# Patient Record
Sex: Female | Born: 1980
Health system: Southern US, Community
[De-identification: ages and names within clinical notes are randomized; demographics above are authoritative.]

## PROBLEM LIST (undated history)

## (undated) DIAGNOSIS — Z8759 Personal history of other complications of pregnancy, childbirth and the puerperium: Secondary | ICD-10-CM

## (undated) DIAGNOSIS — F32A Depression, unspecified: Secondary | ICD-10-CM

## (undated) DIAGNOSIS — F419 Anxiety disorder, unspecified: Secondary | ICD-10-CM

## (undated) DIAGNOSIS — F329 Major depressive disorder, single episode, unspecified: Secondary | ICD-10-CM

## (undated) DIAGNOSIS — F909 Attention-deficit hyperactivity disorder, unspecified type: Secondary | ICD-10-CM

## (undated) DIAGNOSIS — D72819 Decreased white blood cell count, unspecified: Secondary | ICD-10-CM

## (undated) DIAGNOSIS — D649 Anemia, unspecified: Secondary | ICD-10-CM

## (undated) DIAGNOSIS — O1490 Unspecified pre-eclampsia, unspecified trimester: Secondary | ICD-10-CM

## (undated) HISTORY — DX: Attention-deficit hyperactivity disorder, unspecified type: F90.9

## (undated) HISTORY — PX: BREAST BIOPSY: SHX20

## (undated) HISTORY — DX: Depression, unspecified: F32.A

## (undated) HISTORY — DX: Unspecified pre-eclampsia, unspecified trimester: O14.90

## (undated) HISTORY — DX: Anxiety disorder, unspecified: F41.9

## (undated) HISTORY — DX: Decreased white blood cell count, unspecified: D72.819

## (undated) HISTORY — DX: Major depressive disorder, single episode, unspecified: F32.9

## (undated) HISTORY — DX: Anemia, unspecified: D64.9

## (undated) HISTORY — DX: Personal history of other complications of pregnancy, childbirth and the puerperium: Z87.59

---
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
## 2001-05-16 ENCOUNTER — Other Ambulatory Visit: Admission: RE | Admit: 2001-05-16 | Discharge: 2001-05-16 | Payer: Self-pay | Admitting: Family Medicine

## 2002-05-08 ENCOUNTER — Other Ambulatory Visit: Admission: RE | Admit: 2002-05-08 | Discharge: 2002-05-08 | Payer: Self-pay | Admitting: Family Medicine

## 2002-09-22 ENCOUNTER — Ambulatory Visit (HOSPITAL_COMMUNITY): Admission: RE | Admit: 2002-09-22 | Discharge: 2002-09-22 | Payer: Self-pay | Admitting: Oncology

## 2002-09-22 ENCOUNTER — Encounter (INDEPENDENT_AMBULATORY_CARE_PROVIDER_SITE_OTHER): Payer: Self-pay | Admitting: Specialist

## 2003-01-09 ENCOUNTER — Emergency Department (HOSPITAL_COMMUNITY): Admission: EM | Admit: 2003-01-09 | Discharge: 2003-01-09 | Payer: Self-pay | Admitting: Emergency Medicine

## 2003-05-10 ENCOUNTER — Other Ambulatory Visit: Admission: RE | Admit: 2003-05-10 | Discharge: 2003-05-10 | Payer: Self-pay | Admitting: Family Medicine

## 2004-05-21 ENCOUNTER — Other Ambulatory Visit: Admission: RE | Admit: 2004-05-21 | Discharge: 2004-05-21 | Payer: Self-pay | Admitting: Family Medicine

## 2004-05-22 ENCOUNTER — Other Ambulatory Visit: Admission: RE | Admit: 2004-05-22 | Discharge: 2004-05-22 | Payer: Self-pay | Admitting: Family Medicine

## 2004-12-10 ENCOUNTER — Encounter: Admission: RE | Admit: 2004-12-10 | Discharge: 2004-12-10 | Payer: Self-pay | Admitting: Family Medicine

## 2004-12-10 ENCOUNTER — Ambulatory Visit: Payer: Self-pay | Admitting: Family Medicine

## 2004-12-31 ENCOUNTER — Ambulatory Visit: Payer: Self-pay | Admitting: Internal Medicine

## 2005-06-16 ENCOUNTER — Other Ambulatory Visit: Admission: RE | Admit: 2005-06-16 | Discharge: 2005-06-16 | Payer: Self-pay | Admitting: Family Medicine

## 2005-06-16 ENCOUNTER — Ambulatory Visit: Payer: Self-pay | Admitting: Family Medicine

## 2005-07-28 ENCOUNTER — Ambulatory Visit: Payer: Self-pay | Admitting: Family Medicine

## 2005-08-27 ENCOUNTER — Ambulatory Visit: Payer: Self-pay | Admitting: Oncology

## 2005-10-29 ENCOUNTER — Ambulatory Visit: Payer: Self-pay | Admitting: Oncology

## 2007-06-08 ENCOUNTER — Ambulatory Visit: Payer: Self-pay | Admitting: Family Medicine

## 2007-06-08 DIAGNOSIS — G4700 Insomnia, unspecified: Secondary | ICD-10-CM | POA: Insufficient documentation

## 2007-11-16 LAB — CONVERTED CEMR LAB: Pap Smear: NORMAL

## 2008-05-17 ENCOUNTER — Telehealth (INDEPENDENT_AMBULATORY_CARE_PROVIDER_SITE_OTHER): Payer: Self-pay | Admitting: *Deleted

## 2008-06-21 ENCOUNTER — Ambulatory Visit: Payer: Self-pay | Admitting: Family Medicine

## 2008-06-26 ENCOUNTER — Ambulatory Visit: Payer: Self-pay | Admitting: Family Medicine

## 2008-11-21 ENCOUNTER — Ambulatory Visit (HOSPITAL_COMMUNITY): Admission: RE | Admit: 2008-11-21 | Discharge: 2008-11-21 | Payer: Self-pay | Admitting: Obstetrics and Gynecology

## 2009-02-06 ENCOUNTER — Inpatient Hospital Stay (HOSPITAL_COMMUNITY): Admission: AD | Admit: 2009-02-06 | Discharge: 2009-02-10 | Payer: Self-pay | Admitting: Obstetrics and Gynecology

## 2009-02-11 ENCOUNTER — Inpatient Hospital Stay (HOSPITAL_COMMUNITY): Admission: AD | Admit: 2009-02-11 | Discharge: 2009-02-14 | Payer: Self-pay | Admitting: Obstetrics and Gynecology

## 2010-02-17 ENCOUNTER — Encounter: Admission: RE | Admit: 2010-02-17 | Discharge: 2010-02-17 | Payer: Self-pay | Admitting: Obstetrics and Gynecology

## 2011-01-06 LAB — COMPREHENSIVE METABOLIC PANEL
ALT: 22 U/L (ref 0–35)
ALT: 24 U/L (ref 0–35)
ALT: 12 U/L (ref 0–35)
AST: 22 U/L (ref 0–37)
AST: 33 U/L (ref 0–37)
AST: 34 U/L (ref 0–37)
AST: 22 U/L (ref 0–37)
Albumin: 2.2 g/dL — ABNORMAL LOW (ref 3.5–5.2)
Albumin: 2.2 g/dL — ABNORMAL LOW (ref 3.5–5.2)
Albumin: 2.4 g/dL — ABNORMAL LOW (ref 3.5–5.2)
Albumin: 2.2 g/dL — ABNORMAL LOW (ref 3.5–5.2)
Alkaline Phosphatase: 86 U/L (ref 39–117)
Alkaline Phosphatase: 87 U/L (ref 39–117)
Alkaline Phosphatase: 99 U/L (ref 39–117)
Alkaline Phosphatase: 86 U/L (ref 39–117)
BUN: 6 mg/dL (ref 6–23)
BUN: 9 mg/dL (ref 6–23)
CO2: 26 mEq/L (ref 19–32)
CO2: 28 mEq/L (ref 19–32)
Calcium: 8.3 mg/dL — ABNORMAL LOW (ref 8.4–10.5)
Chloride: 102 mEq/L (ref 96–112)
Chloride: 104 mEq/L (ref 96–112)
Chloride: 108 mEq/L (ref 96–112)
Creatinine, Ser: 0.68 mg/dL (ref 0.4–1.2)
Creatinine, Ser: 0.7 mg/dL (ref 0.4–1.2)
GFR calc Af Amer: >60 mL/min (ref >60)
GFR calc Af Amer: >60 mL/min (ref >60)
GFR calc non Af Amer: >60 mL/min (ref >60)
GFR calc non Af Amer: >60 mL/min (ref >60)
GFR calc non Af Amer: >60 mL/min (ref >60)
Glucose, Bld: 79 mg/dL (ref 70–99)
Glucose, Bld: 84 mg/dL (ref 70–99)
Potassium: 2.6 mEq/L — CL (ref 3.5–5.1)
Potassium: 3 mEq/L — ABNORMAL LOW (ref 3.5–5.1)
Potassium: 3.4 mEq/L — ABNORMAL LOW (ref 3.5–5.1)
Potassium: 3 mEq/L — ABNORMAL LOW (ref 3.5–5.1)
Sodium: 138 mEq/L (ref 135–145)
Sodium: 138 mEq/L (ref 135–145)
Sodium: 135 mEq/L (ref 135–145)
Sodium: 137 mEq/L (ref 135–145)
Total Bilirubin: 0.5 mg/dL (ref 0.3–1.2)
Total Bilirubin: 0.6 mg/dL (ref 0.3–1.2)
Total Bilirubin: 0.3 mg/dL (ref 0.3–1.2)
Total Protein: 4.9 g/dL — ABNORMAL LOW (ref 6.0–8.3)
Total Protein: 5.4 g/dL — ABNORMAL LOW (ref 6.0–8.3)
Total Protein: 4.5 g/dL — ABNORMAL LOW (ref 6.0–8.3)
Total Protein: 5.1 g/dL — ABNORMAL LOW (ref 6.0–8.3)

## 2011-01-06 LAB — CBC
HCT: 19 % — ABNORMAL LOW (ref 36.0–46.0)
HCT: 21.8 % — ABNORMAL LOW (ref 36.0–46.0)
HCT: 23.2 % — ABNORMAL LOW (ref 36.0–46.0)
Hemoglobin: 8.1 g/dL — ABNORMAL LOW (ref 12.0–15.0)
Hemoglobin: 8.2 g/dL — ABNORMAL LOW (ref 12.0–15.0)
MCHC: 34.5 g/dL (ref 30.0–36.0)
MCHC: 34.8 g/dL (ref 30.0–36.0)
MCHC: 35.3 g/dL (ref 30.0–36.0)
MCV: 90.7 fL (ref 78.0–100.0)
MCV: 90.7 fL (ref 78.0–100.0)
MCV: 91.5 fL (ref 78.0–100.0)
MCV: 89.7 fL (ref 78.0–100.0)
MCV: 90 fL (ref 78.0–100.0)
Platelets: 303 10*3/uL (ref 150–400)
Platelets: 340 10*3/uL (ref 150–400)
Platelets: 168 10*3/uL (ref 150–400)
Platelets: 200 10*3/uL (ref 150–400)
Platelets: 209 10*3/uL (ref 150–400)
RDW: 14.6 % (ref 11.5–15.5)
RDW: 13.6 % (ref 11.5–15.5)
RDW: 13.7 % (ref 11.5–15.5)
RDW: 13.9 % (ref 11.5–15.5)
WBC: 6.4 10*3/uL (ref 4.0–10.5)
WBC: 7.1 10*3/uL (ref 4.0–10.5)

## 2011-01-06 LAB — BLOOD GAS, ARTERIAL
Acid-Base Excess: 0.3 mmol/L (ref 0.0–2.0)
Bicarbonate: 23.1 mEq/L (ref 20.0–24.0)
pCO2 arterial: 31.2 mmHg — ABNORMAL LOW (ref 35.0–45.0)
pO2, Arterial: 73.7 mmHg — ABNORMAL LOW (ref 80.0–100.0)

## 2011-01-06 LAB — URINE MICROSCOPIC-ADD ON

## 2011-01-06 LAB — URINALYSIS, ROUTINE W REFLEX MICROSCOPIC
Glucose, UA: NEGATIVE mg/dL
Ketones, ur: NEGATIVE mg/dL
Leukocytes, UA: NEGATIVE
pH: 6 (ref 5.0–8.0)

## 2011-01-06 LAB — RPR: RPR Ser Ql: NONREACTIVE

## 2011-01-06 LAB — URIC ACID: Uric Acid, Serum: 5.7 mg/dL (ref 2.4–7.0)

## 2011-01-06 LAB — LACTATE DEHYDROGENASE
LDH: 294 U/L — ABNORMAL HIGH (ref 94–250)
LDH: 319 U/L — ABNORMAL HIGH (ref 94–250)

## 2011-01-06 LAB — RH IMMUNE GLOB WKUP(>/=20WKS)(NOT WOMEN'S HOSP)

## 2011-02-10 NOTE — Op Note (Signed)
[name], [name]           ACCOUNT NO.:  [id number]   MEDICAL RECORD NO.:  [id number]          PATIENT TYPE:  INP   LOCATION:  9148                          FACILITY:  [facility]   PHYSICIAN:  Michelle L. Grewal, M.D.DATE OF BIRTH:  1981/02/04   DATE OF PROCEDURE:  02/07/2009  DATE OF DISCHARGE:                               OPERATIVE REPORT   PREOPERATIVE DIAGNOSES:  Intrauterine pregnancy at 37-6/7, PIH arrest of  dilation at 6 cm, and chorioamnionitis.   POSTOPERATIVE DIAGNOSES:  Intrauterine pregnancy at 37-6/7, PIH arrest  of dilation at 6 cm, and chorioamnionitis.   PROCEDURE:  Primary low transverse cesarean section.   SURGEON:  Michelle L. Vincente Poli, MD   ANESTHESIA:  Epidural.   FINDINGS:  Female infant in OP position and weighed 7 pounds 12 ounces.   ESTIMATED BLOOD LOSS:  Less than 500 mL.   COMPLICATIONS:  None.   DRAINS:  Foley.   PROCEDURE:  The patient was taken to the operating room after her  epidural was dosed and found to be adequate.  She was prepped and draped  in usual sterile fashion.  A low transverse incision was made and  carried down to the fascia.  Fascia scored in the midline extended  laterally.  The rectus muscles were separated in the midline.  The  peritoneum was entered bluntly.  The peritoneal incision was then  stretched.  The bladder blade was inserted.  The lower uterine segment  was identified and the bladder blade was created sharply and then  digitally.  The bladder blade was then readjusted.  A low transverse  incision was made in the uterus.  Uterus was entered using a hemostat.  The baby was in OP position and was delivered easily with 2 gentle pulls  of the vacuum with no pop-off.  The baby was a female infant.  Apgars 9 at  1 minute and 9 at 5 minutes and weighed 7 pounds 12 ounces.  The cord  was clamped and cut.  The baby was handed to the awaiting pediatricians  and taken to the nursery.  Pitocin was given.  The placenta was  manually  removed noted to be normal intact with a 3-vessel cord.  The uterus was  exteriorized and cleared of all clots and debris.  The uterine incision  was closed in one layer using O chromic in a running locked stitch.  It  was then closed in the second layer using O chromic and hemostasis was  excellent.  The uterus was returned to the abdomen.  Irrigation was  performed.  Hemostasis was excellent.  The peritoneum and rectus muscles  were reapproximated using O Vicryl.  The fascia was closed using O  Vicryl and running stitch.  After irrigation of the subcutaneous layer,  the skin was closed with staples.  All sponge, lap, and instrument  counts were correct x2.  The patient went to recovery room in stable  condition.      Michelle L. Vincente Poli, M.D.  Electronically Signed     MLG/MEDQ  D:  02/07/2009  T:  02/08/2009  Job:  [id number]

## 2011-02-10 NOTE — H&P (Signed)
NAME:  [name], [name] NO.:  [id number]   MEDICAL RECORD NO.:  [id number]          PATIENT TYPE:  INP   LOCATION:  9372                          FACILITY:  [facility]   PHYSICIAN:  Juluis Mire, M.D.   DATE OF BIRTH:  1981/06/12   DATE OF ADMISSION:  02/11/2009  DATE OF DISCHARGE:                              HISTORY & PHYSICAL   HISTORY OF PRESENT ILLNESS:  The patient is a 30 year old female  delivered by cesarean section.  Presented to the office today  complaining of anxiety and shortness of breath.  Her blood pressure  noted to be elevated in the office.  She was sent over to triage.  PO2  was slightly depressed.  Subsequent CT scan revealed no evidence of  pulmonary embolus, but she did have evidence of pulmonary edema.  In  looking at her [facility] panel, her LDH was slightly elevated.  In a view that  she will be admitted to manage postpartum preeclampsia with associated  pulmonary edema.   ALLERGIES:  She is allergic to SULFA.   MEDICATIONS:  Percocet as needed for pain, iron sulfate supplementation.   For past medical history, family history, and social history, please see  prenatal records.   REVIEW OF SYSTEMS:  Noncontributory.   PHYSICAL EXAMINATION:  VITAL SIGNS:  The patient is afebrile.  Blood  pressure on admission was 162/114, pulse 79, respiratory rate 22.  LUNGS:  Some basilar rales.  CARDIAC SYSTEM:  Regular rhythm and rate.  There was no murmurs or  gallops.  ABDOMEN:  The fundus was firm well below the umbilicus, low transverse  incision intact.  PELVIC:  Deferred.  EXTREMITIES:  2+ edema.  Deep tendon reflexes 2+.  No clonus.  NEUROLOGIC:  Grossly within normal limits.   IMPRESSION:  Postpartum with evidence of pulmonary edema and mildly  elevated liver function test.   PLAN:  Because the patient is allergic to sulfa, we will hold off  magnesium sulfate.  We are going to start her on labetalol p.o., give  her Lasix IV for the pulmonary  edema.  Repeat labs tomorrow and may need  a chest x-ray tomorrow also, pending the patient's response, she will be  kept on O2 and bedrest with bathroom privileges.      Juluis Mire, M.D.  Electronically Signed     JSM/MEDQ  D:  02/11/2009  T:  02/12/2009  Job:  [id number]

## 2011-02-13 NOTE — H&P (Signed)
   [name], [name]                      ACCOUNT NO.:  [id number]   MEDICAL RECORD NO.:  [id number]                   PATIENT TYPE:  OUT   LOCATION:  OMED                                 FACILITY:  [facility]   PHYSICIAN:  Pierce Crane, M.D.                   DATE OF BIRTH:  January 02, 1981   DATE OF ADMISSION:  09/22/2002  DATE OF DISCHARGE:                                HISTORY & PHYSICAL   PROCEDURE:  Bone marrow biopsy and aspirate.   ONCOLOGIST:  Pierce Crane, M.D.   ANESTHESIA:  Sedation/local.   INDICATIONS:  The patient is a 30 year old woman who presented for bone  marrow biopsy.   DESCRIPTION OF PROCEDURE:  She was placed in the prone position.  She was  anesthetized with 25 mg of Demerol and 4 mg of Versed.  She was given 2%  Xylocaine local anesthesia.  Bone marrow aspirate and biopsy were performed.   The patient tolerated the procedure well and was discharged back to the  medical/surgical day care area in good condition.                                               Pierce Crane, M.D.    PR/MEDQ  D:  09/22/2002  T:  09/22/2002  Job:  [id number]

## 2011-02-13 NOTE — Discharge Summary (Signed)
[name], [name]           ACCOUNT NO.:  [id number]   MEDICAL RECORD NO.:  [id number]          PATIENT TYPE:  INP   LOCATION:  9148                          FACILITY:  [facility]   PHYSICIAN:  Zelphia Cairo, MD    DATE OF BIRTH:  September 19, 1981   DATE OF ADMISSION:  02/06/2009  DATE OF DISCHARGE:  02/10/2009                               DISCHARGE SUMMARY   ADMITTING DIAGNOSES:  1. Intrauterine pregnancy at 37-5/7th weeks' estimated gestational      age.  2. Pregnancy-induced hypertension.  3. Two-stage induction of labor.   DISCHARGE DIAGNOSES:  1. Status post low transverse cesarean section secondary to arrest of      descent.  2. Viable female infant.   PROCEDURE:  Primary low transverse cesarean section.   REASON FOR ADMISSION:  Please see written H and       P.   HOSPITAL COURSE:  The patient is a 28-year white married female  primigravida who was admitted to [facility] at 37-5/7th  weeks' estimated gestational age for two-stage induction of labor.  The  patient's prenatal care had been complicated by elevation in blood  pressure.  The patient had been seen in the office where blood pressure  was noted to be elevated with blood pressures of 142/95 to 150/98.  Cervix was examined and found to be fingertip, 30% effaced, vertex at -3  station.  Fetal heart tones were reactive.  PIH labs were drawn which  were within normal limits except for the potassium which was at 2.6.  The patient was now admitted for a two-stage induction of labor.  On the  following morning, blood pressure was 148/103.  Fetal heart tones were  reassuring.  Contractions were approximately every 2-4 minutes.  Cervix  was reexamined and found to be 2 cm, 90% effaced, vertex at -2 station.  Artificial rupture of membranes was performed which revealed clear  fluid.  Epidural was placed for the patient's comfort, and she was  started on Pitocin.  Throughout the day, the patient was noted to have  a  good pattern of labor.  At approximately 6:30 that evening, temperature  was noted to be 100.9 after 2 doses of Tylenol and antibiotics.  Cervix  was reexamined and found to be 6 cm, completely effaced with vertex now  noted to have some caput.  Decision was made to proceed with a primary  low transverse cesarean section.  The patient was then transferred to  the operating room where epidural was dosed to an adequate surgical  level.  A low transverse incision was made with delivery of a viable  female infant weighing 7 pounds 12 ounces with Apgars of 9 at 1 minute and  9 at 5 minutes.  The patient tolerated the procedure well and was taken  to the recovery room in stable condition.  On postoperative day 1, the  patient was without complaint.  Vital signs were stable.  Blood pressure  was 133/88 to 127/81.  Abdomen soft.  Fundus firm and nontender, and  scant amount of drainage was noted on the bandage.  Foley was  draining  with adequate urine output.  Labs revealed hemoglobin of 7.5 of which  hemoglobin had been 8.2 on admission, WBC count of 7.9, platelet count  of 200,000.  The patient continued on Unasyn, and Foley was discontinued  when she was ambulating.  On postoperative day 2, the patient was  without complaint.  Vital signs were stable.  She was afebrile.  Abdominal dressing was noted to be clean, dry, and intact.  On  postoperative day 3, the patient was without complaint.  Vital signs  remained stable.  Blood pressure was 132-144 over 79-89.  Abdomen soft,  nontender.  Incision was clean, dry, and intact.  Staples removed, and  the patient was later discharged home.   CONDITION ON DISCHARGE:  Stable.   DIET:  Regular as tolerated.   ACTIVITY:  No heavy lifting.  No driving x2 weeks.  No vaginal entry.   FOLLOWUP:  The patient is to follow up in the office in 1-2 weeks for an  incision check.  She is to call for temperature greater than 100  degrees, persistent nausea or  vomiting, heavy vaginal bleeding, and/or  redness or drainage from the incisional site.  The patient was also  instructed to call for headache, blurred vision, or right upper quadrant  pain.   DISCHARGE MEDICATIONS:  1. Tylox #30 one p.o. every 4-6 hours p.r.n.  2. Motrin 600 mg every 6 hours.  3. Prenatal vitamins 1 p.o. daily.  4. Colace 1 p.o. daily p.r.n.      Julio Sicks, N.P.      Zelphia Cairo, MD  Electronically Signed    CC/MEDQ  D:  03/04/2009  T:  03/05/2009  Job:  [id number]

## 2011-06-29 LAB — HM PAP SMEAR

## 2012-05-11 ENCOUNTER — Ambulatory Visit (INDEPENDENT_AMBULATORY_CARE_PROVIDER_SITE_OTHER): Payer: BC Managed Care – PPO | Admitting: Family Medicine

## 2012-05-11 ENCOUNTER — Encounter: Payer: Self-pay | Admitting: Family Medicine

## 2012-05-11 VITALS — BP 124/62 | HR 90 | Temp 97.7°F | Ht 66.5 in | Wt 191.6 lb

## 2012-05-11 DIAGNOSIS — R319 Hematuria, unspecified: Secondary | ICD-10-CM

## 2012-05-11 DIAGNOSIS — Z0000 Encounter for general adult medical examination without abnormal findings: Secondary | ICD-10-CM

## 2012-05-11 DIAGNOSIS — F419 Anxiety disorder, unspecified: Secondary | ICD-10-CM | POA: Insufficient documentation

## 2012-05-11 DIAGNOSIS — Z23 Encounter for immunization: Secondary | ICD-10-CM

## 2012-05-11 DIAGNOSIS — F411 Generalized anxiety disorder: Secondary | ICD-10-CM

## 2012-05-11 LAB — HEPATIC FUNCTION PANEL
ALT: 14 U/L (ref 0–35)
AST: 18 U/L (ref 0–37)
Albumin: 4.2 g/dL (ref 3.5–5.2)
Alkaline Phosphatase: 61 U/L (ref 39–117)
Total Protein: 7.2 g/dL (ref 6.0–8.3)

## 2012-05-11 LAB — POCT URINALYSIS DIPSTICK
Bilirubin, UA: NEGATIVE
Glucose, UA: NEGATIVE
Ketones, UA: NEGATIVE
Leukocytes, UA: NEGATIVE
Nitrite, UA: NEGATIVE

## 2012-05-11 LAB — CBC WITH DIFFERENTIAL/PLATELET
Basophils Relative: 0.6 % (ref 0.0–3.0)
Eosinophils Absolute: 0.1 10*3/uL (ref 0.0–0.7)
Eosinophils Relative: 2.5 % (ref 0.0–5.0)
Hemoglobin: 12 g/dL (ref 12.0–15.0)
Lymphocytes Relative: 27.9 % (ref 12.0–46.0)
MCHC: 33.2 g/dL (ref 30.0–36.0)
Neutro Abs: 2.9 10*3/uL (ref 1.4–7.7)
RBC: 4 Mil/uL (ref 3.87–5.11)
WBC: 4.5 10*3/uL (ref 4.5–10.5)

## 2012-05-11 LAB — TSH: TSH: 1.08 u[IU]/mL (ref 0.35–5.50)

## 2012-05-11 LAB — BASIC METABOLIC PANEL
CO2: 27 mEq/L (ref 19–32)
Calcium: 9 mg/dL (ref 8.4–10.5)
Chloride: 102 mEq/L (ref 96–112)
Sodium: 137 mEq/L (ref 135–145)

## 2012-05-11 LAB — LIPID PANEL
HDL: 64.5 mg/dL (ref >39.00)
Total CHOL/HDL Ratio: 3

## 2012-05-11 MED ORDER — ESCITALOPRAM OXALATE 10 MG PO TABS: 10.0000 mg | ORAL_TABLET | Freq: Every day | ORAL | Status: DC

## 2012-05-11 NOTE — Progress Notes (Signed)
Subjective:     [name] is a 31 y.o. female and is here for a comprehensive physical exam. The patient reports no problems.  History   Social History  . Marital Status: Married    Spouse Name: N/A    Number of Children: N/A  . Years of Education: N/A   Occupational History  . [employer], sports   Social History Main Topics  . Smoking status: Never Smoker   . Smokeless tobacco: Never Used  . Alcohol Use: No  . Drug Use: No  . Sexually Active: Yes -- Female partner(s)   Other Topics Concern  . Not on file   Social History Narrative  . No narrative on file   Health Maintenance  Topic Date Due  . Tetanus/tdap  01/07/2001  . Influenza Vaccine  06/28/2012  . Pap Smear  07/11/2014    The following portions of the patient's history were reviewed and updated as appropriate: allergies, current medications, past family history, past medical history, past social history, past surgical history and problem list.  Review of Systems Review of Systems  Constitutional: Negative for activity change, appetite change and fatigue.  HENT: Negative for hearing loss, congestion, tinnitus and ear discharge.  dentist q60m Eyes: Negative for visual disturbance (see optho q1y -- vision corrected to 20/20 with glasses).  Respiratory: Negative for cough, chest tightness and shortness of breath.   Cardiovascular: Negative for chest pain, palpitations and leg swelling.  Gastrointestinal: Negative for abdominal pain, diarrhea, constipation and abdominal distention.  Genitourinary: Negative for urgency, frequency, decreased urine volume and difficulty urinating.  Musculoskeletal: Negative for back pain, arthralgias and gait problem.  Skin: Negative for color change, pallor and rash.  Neurological: Negative for dizziness, light-headedness, numbness and headaches.  Hematological: Negative for adenopathy. Does not bruise/bleed easily.  Psychiatric/Behavioral: Negative for  suicidal ideas, confusion, sleep disturbance, self-injury, dysphoric mood, decreased concentration and agitation.       Objective:   BP 124/62  Pulse 90  Temp 97.7 F (36.5 C) (Oral)  Ht 5' 6.5" (1.689 m)  Wt 191 lb 9.6 oz (86.909 kg)  BMI 30.46 kg/m2  SpO2 98%  LMP 04/24/2012  General Appearance:    Alert, cooperative, no distress, appears stated age  Head:    Normocephalic, without obvious abnormality, atraumatic  Eyes:    PERRL, conjunctiva/corneas clear, EOM's intact, fundi    benign, both eyes  Ears:    Normal TM's and external ear canals, both ears  Nose:   Nares normal, septum midline, mucosa normal, no drainage    or sinus tenderness  Throat:   Lips, mucosa, and tongue normal; teeth and gums normal  Neck:   Supple, symmetrical, trachea midline, no adenopathy;    thyroid:  no enlargement/tenderness/nodules; no carotid   bruit or JVD  Back:     Symmetric, no curvature, ROM normal, no CVA tenderness  Lungs:     Clear to auscultation bilaterally, respirations unlabored  Chest Wall:    No tenderness or deformity   Heart:    Regular rate and rhythm, S1 and S2 normal, no murmur, rub   or gallop  Breast Exam:    gyn  Abdomen:     Soft, non-tender, bowel sounds active all four quadrants,    no masses, no organomegaly  Genitalia:    gyn  Rectal:    gyn  Extremities:   Extremities normal, atraumatic, no cyanosis or edema  Pulses:   2+ and symmetric  all extremities  Skin:   Skin color, texture, turgor normal, no rashes or lesions  Lymph nodes:   Cervical, supraclavicular, and axillary nodes normal  Neurologic:   CNII-XII intact, normal strength, sensation and reflexes    throughout   Assessment:    Healthy female exam.     Plan:  ghm utd Check labs   See After Visit Summary for Counseling Recommendations

## 2012-05-11 NOTE — Patient Instructions (Addendum)
Preventive Care for Adults, Female A healthy lifestyle and preventive care can promote health and wellness. Preventive health guidelines for women include the following key practices.  A routine yearly physical is a good way to check with your caregiver about your health and preventive screening. It is a chance to share any concerns and updates on your health, and to receive a thorough exam.   Visit your dentist for a routine exam and preventive care every 6 months. Brush your teeth twice a day and floss once a day. Good oral hygiene prevents tooth decay and gum disease.   The frequency of eye exams is based on your age, health, family medical history, use of contact lenses, and other factors. Follow your caregiver's recommendations for frequency of eye exams.   Eat a healthy diet. Foods like vegetables, fruits, whole grains, low-fat dairy products, and lean protein foods contain the nutrients you need without too many calories. Decrease your intake of foods high in solid fats, added sugars, and salt. Eat the right amount of calories for you.Get information about a proper diet from your caregiver, if necessary.   Regular physical exercise is one of the most important things you can do for your health. Most adults should get at least 150 minutes of moderate-intensity exercise (any activity that increases your heart rate and causes you to sweat) each week. In addition, most adults need muscle-strengthening exercises on 2 or more days a week.   Maintain a healthy weight. The body mass index (BMI) is a screening tool to identify possible weight problems. It provides an estimate of body fat based on height and weight. Your caregiver can help determine your BMI, and can help you achieve or maintain a healthy weight.For adults 20 years and older:   A BMI below 18.5 is considered underweight.   A BMI of 18.5 to 24.9 is normal.   A BMI of 25 to 29.9 is considered overweight.   A BMI of 30 and above is  considered obese.   Maintain normal blood lipids and cholesterol levels by exercising and minimizing your intake of saturated fat. Eat a balanced diet with plenty of fruit and vegetables. Blood tests for lipids and cholesterol should begin at age 20 and be repeated every 5 years. If your lipid or cholesterol levels are high, you are over 50, or you are at high risk for heart disease, you may need your cholesterol levels checked more frequently.Ongoing high lipid and cholesterol levels should be treated with medicines if diet and exercise are not effective.   If you smoke, find out from your caregiver how to quit. If you do not use tobacco, do not start.   If you are pregnant, do not drink alcohol. If you are breastfeeding, be very cautious about drinking alcohol. If you are not pregnant and choose to drink alcohol, do not exceed 1 drink per day. One drink is considered to be 12 ounces (355 mL) of beer, 5 ounces (148 mL) of wine, or 1.5 ounces (44 mL) of liquor.   Avoid use of street drugs. Do not share needles with anyone. Ask for help if you need support or instructions about stopping the use of drugs.   High blood pressure causes heart disease and increases the risk of stroke. Your blood pressure should be checked at least every 1 to 2 years. Ongoing high blood pressure should be treated with medicines if weight loss and exercise are not effective.   If you are 55 to 30   years old, ask your caregiver if you should take aspirin to prevent strokes.   Diabetes screening involves taking a blood sample to check your fasting blood sugar level. This should be done once every 3 years, after age 45, if you are within normal weight and without risk factors for diabetes. Testing should be considered at a younger age or be carried out more frequently if you are overweight and have at least 1 risk factor for diabetes.   Breast cancer screening is essential preventive care for women. You should practice "breast  self-awareness." This means understanding the normal appearance and feel of your breasts and may include breast self-examination. Any changes detected, no matter how small, should be reported to a caregiver. Women in their 20s and 30s should have a clinical breast exam (CBE) by a caregiver as part of a regular health exam every 1 to 3 years. After age 40, women should have a CBE every year. Starting at age 40, women should consider having a mammography (breast X-ray test) every year. Women who have a family history of breast cancer should talk to their caregiver about genetic screening. Women at a high risk of breast cancer should talk to their caregivers about having magnetic resonance imaging (MRI) and a mammography every year.   The Pap test is a screening test for cervical cancer. A Pap test can show cell changes on the cervix that might become cervical cancer if left untreated. A Pap test is a procedure in which cells are obtained and examined from the lower end of the uterus (cervix).   Women should have a Pap test starting at age 21.   Between ages 21 and 29, Pap tests should be repeated every 2 years.   Beginning at age 30, you should have a Pap test every 3 years as long as the past 3 Pap tests have been normal.   Some women have medical problems that increase the chance of getting cervical cancer. Talk to your caregiver about these problems. It is especially important to talk to your caregiver if a new problem develops soon after your last Pap test. In these cases, your caregiver may recommend more frequent screening and Pap tests.   The above recommendations are the same for women who have or have not gotten the vaccine for human papillomavirus (HPV).   If you had a hysterectomy for a problem that was not cancer or a condition that could lead to cancer, then you no longer need Pap tests. Even if you no longer need a Pap test, a regular exam is a good idea to make sure no other problems are  starting.   If you are between ages 65 and 70, and you have had normal Pap tests going back 10 years, you no longer need Pap tests. Even if you no longer need a Pap test, a regular exam is a good idea to make sure no other problems are starting.   If you have had past treatment for cervical cancer or a condition that could lead to cancer, you need Pap tests and screening for cancer for at least 20 years after your treatment.   If Pap tests have been discontinued, risk factors (such as a new sexual partner) need to be reassessed to determine if screening should be resumed.   The HPV test is an additional test that may be used for cervical cancer screening. The HPV test looks for the virus that can cause the cell changes on the cervix.   The cells collected during the Pap test can be tested for HPV. The HPV test could be used to screen women aged 30 years and older, and should be used in women of any age who have unclear Pap test results. After the age of 30, women should have HPV testing at the same frequency as a Pap test.   Colorectal cancer can be detected and often prevented. Most routine colorectal cancer screening begins at the age of 50 and continues through age 75. However, your caregiver may recommend screening at an earlier age if you have risk factors for colon cancer. On a yearly basis, your caregiver may provide home test kits to check for hidden blood in the stool. Use of a small camera at the end of a tube, to directly examine the colon (sigmoidoscopy or colonoscopy), can detect the earliest forms of colorectal cancer. Talk to your caregiver about this at age 50, when routine screening begins. Direct examination of the colon should be repeated every 5 to 10 years through age 75, unless early forms of pre-cancerous polyps or small growths are found.   Hepatitis C blood testing is recommended for all people born from 1945 through 1965 and any individual with known risks for hepatitis C.    Practice safe sex. Use condoms and avoid high-risk sexual practices to reduce the spread of sexually transmitted infections (STIs). STIs include gonorrhea, chlamydia, syphilis, trichomonas, herpes, HPV, and human immunodeficiency virus (HIV). Herpes, HIV, and HPV are viral illnesses that have no cure. They can result in disability, cancer, and death. Sexually active women aged 25 and younger should be checked for chlamydia. Older women with new or multiple partners should also be tested for chlamydia. Testing for other STIs is recommended if you are sexually active and at increased risk.   Osteoporosis is a disease in which the bones lose minerals and strength with aging. This can result in serious bone fractures. The risk of osteoporosis can be identified using a bone density scan. Women ages 65 and over and women at risk for fractures or osteoporosis should discuss screening with their caregivers. Ask your caregiver whether you should take a calcium supplement or vitamin D to reduce the rate of osteoporosis.   Menopause can be associated with physical symptoms and risks. Hormone replacement therapy is available to decrease symptoms and risks. You should talk to your caregiver about whether hormone replacement therapy is right for you.   Use sunscreen with sun protection factor (SPF) of 30 or more. Apply sunscreen liberally and repeatedly throughout the day. You should seek shade when your shadow is shorter than you. Protect yourself by wearing long sleeves, pants, a wide-brimmed hat, and sunglasses year round, whenever you are outdoors.   Once a month, do a whole body skin exam, using a mirror to look at the skin on your back. Notify your caregiver of new moles, moles that have irregular borders, moles that are larger than a pencil eraser, or moles that have changed in shape or color.   Stay current with required immunizations.   Influenza. You need a dose every fall (or winter). The composition of  the flu vaccine changes each year, so being vaccinated once is not enough.   Pneumococcal polysaccharide. You need 1 to 2 doses if you smoke cigarettes or if you have certain chronic medical conditions. You need 1 dose at age 65 (or older) if you have never been vaccinated.   Tetanus, diphtheria, pertussis (Tdap, Td). Get 1 dose of   Tdap vaccine if you are younger than age 65, are over 65 and have contact with an infant, are a healthcare worker, are pregnant, or simply want to be protected from whooping cough. After that, you need a Td booster dose every 10 years. Consult your caregiver if you have not had at least 3 tetanus and diphtheria-containing shots sometime in your life or have a deep or dirty wound.   HPV. You need this vaccine if you are a woman age 26 or younger. The vaccine is given in 3 doses over 6 months.   Measles, mumps, rubella (MMR). You need at least 1 dose of MMR if you were born in 1957 or later. You may also need a second dose.   Meningococcal. If you are age 19 to 21 and a first-year college student living in a residence hall, or have one of several medical conditions, you need to get vaccinated against meningococcal disease. You may also need additional booster doses.   Zoster (shingles). If you are age 60 or older, you should get this vaccine.   Varicella (chickenpox). If you have never had chickenpox or you were vaccinated but received only 1 dose, talk to your caregiver to find out if you need this vaccine.   Hepatitis A. You need this vaccine if you have a specific risk factor for hepatitis A virus infection or you simply wish to be protected from this disease. The vaccine is usually given as 2 doses, 6 to 18 months apart.   Hepatitis B. You need this vaccine if you have a specific risk factor for hepatitis B virus infection or you simply wish to be protected from this disease. The vaccine is given in 3 doses, usually over 6 months.  Preventive Services /  Frequency Ages 19 to 39  Blood pressure check.** / Every 1 to 2 years.   Lipid and cholesterol check.** / Every 5 years beginning at age 20.   Clinical breast exam.** / Every 3 years for women in their 20s and 30s.   Pap test.** / Every 2 years from ages 21 through 29. Every 3 years starting at age 30 through age 65 or 70 with a history of 3 consecutive normal Pap tests.   HPV screening.** / Every 3 years from ages 30 through ages 65 to 70 with a history of 3 consecutive normal Pap tests.   Hepatitis C blood test.** / For any individual with known risks for hepatitis C.   Skin self-exam. / Monthly.   Influenza immunization.** / Every year.   Pneumococcal polysaccharide immunization.** / 1 to 2 doses if you smoke cigarettes or if you have certain chronic medical conditions.   Tetanus, diphtheria, pertussis (Tdap, Td) immunization. / A one-time dose of Tdap vaccine. After that, you need a Td booster dose every 10 years.   HPV immunization. / 3 doses over 6 months, if you are 26 and younger.   Measles, mumps, rubella (MMR) immunization. / You need at least 1 dose of MMR if you were born in 1957 or later. You may also need a second dose.   Meningococcal immunization. / 1 dose if you are age 19 to 21 and a first-year college student living in a residence hall, or have one of several medical conditions, you need to get vaccinated against meningococcal disease. You may also need additional booster doses.   Varicella immunization.** / Consult your caregiver.   Hepatitis A immunization.** / Consult your caregiver. 2 doses, 6 to 18 months   apart.   Hepatitis B immunization.** / Consult your caregiver. 3 doses usually over 6 months.  Ages 40 to 64  Blood pressure check.** / Every 1 to 2 years.   Lipid and cholesterol check.** / Every 5 years beginning at age 20.   Clinical breast exam.** / Every year after age 40.   Mammogram.** / Every year beginning at age 40 and continuing for as  long as you are in good health. Consult with your caregiver.   Pap test.** / Every 3 years starting at age 30 through age 65 or 70 with a history of 3 consecutive normal Pap tests.   HPV screening.** / Every 3 years from ages 30 through ages 65 to 70 with a history of 3 consecutive normal Pap tests.   Fecal occult blood test (FOBT) of stool. / Every year beginning at age 50 and continuing until age 75. You may not need to do this test if you get a colonoscopy every 10 years.   Flexible sigmoidoscopy or colonoscopy.** / Every 5 years for a flexible sigmoidoscopy or every 10 years for a colonoscopy beginning at age 50 and continuing until age 75.   Hepatitis C blood test.** / For all people born from 1945 through 1965 and any individual with known risks for hepatitis C.   Skin self-exam. / Monthly.   Influenza immunization.** / Every year.   Pneumococcal polysaccharide immunization.** / 1 to 2 doses if you smoke cigarettes or if you have certain chronic medical conditions.   Tetanus, diphtheria, pertussis (Tdap, Td) immunization.** / A one-time dose of Tdap vaccine. After that, you need a Td booster dose every 10 years.   Measles, mumps, rubella (MMR) immunization. / You need at least 1 dose of MMR if you were born in 1957 or later. You may also need a second dose.   Varicella immunization.** / Consult your caregiver.   Meningococcal immunization.** / Consult your caregiver.   Hepatitis A immunization.** / Consult your caregiver. 2 doses, 6 to 18 months apart.   Hepatitis B immunization.** / Consult your caregiver. 3 doses, usually over 6 months.  Ages 65 and over  Blood pressure check.** / Every 1 to 2 years.   Lipid and cholesterol check.** / Every 5 years beginning at age 20.   Clinical breast exam.** / Every year after age 40.   Mammogram.** / Every year beginning at age 40 and continuing for as long as you are in good health. Consult with your caregiver.   Pap test.** /  Every 3 years starting at age 30 through age 65 or 70 with a 3 consecutive normal Pap tests. Testing can be stopped between 65 and 70 with 3 consecutive normal Pap tests and no abnormal Pap or HPV tests in the past 10 years.   HPV screening.** / Every 3 years from ages 30 through ages 65 or 70 with a history of 3 consecutive normal Pap tests. Testing can be stopped between 65 and 70 with 3 consecutive normal Pap tests and no abnormal Pap or HPV tests in the past 10 years.   Fecal occult blood test (FOBT) of stool. / Every year beginning at age 50 and continuing until age 75. You may not need to do this test if you get a colonoscopy every 10 years.   Flexible sigmoidoscopy or colonoscopy.** / Every 5 years for a flexible sigmoidoscopy or every 10 years for a colonoscopy beginning at age 50 and continuing until age 75.   Hepatitis   C blood test.** / For all people born from 1945 through 1965 and any individual with known risks for hepatitis C.   Osteoporosis screening.** / A one-time screening for women ages 65 and over and women at risk for fractures or osteoporosis.   Skin self-exam. / Monthly.   Influenza immunization.** / Every year.   Pneumococcal polysaccharide immunization.** / 1 dose at age 65 (or older) if you have never been vaccinated.   Tetanus, diphtheria, pertussis (Tdap, Td) immunization. / A one-time dose of Tdap vaccine if you are over 65 and have contact with an infant, are a healthcare worker, or simply want to be protected from whooping cough. After that, you need a Td booster dose every 10 years.   Varicella immunization.** / Consult your caregiver.   Meningococcal immunization.** / Consult your caregiver.   Hepatitis A immunization.** / Consult your caregiver. 2 doses, 6 to 18 months apart.   Hepatitis B immunization.** / Check with your caregiver. 3 doses, usually over 6 months.  ** Family history and personal history of risk and conditions may change your caregiver's  recommendations. Document Released: 11/10/2001 Document Revised: 09/03/2011 Document Reviewed: 02/09/2011 [employer] Patient Information 2012 [employer]. 

## 2012-05-13 LAB — URINE CULTURE: Organism ID, Bacteria: NO GROWTH

## 2012-05-18 ENCOUNTER — Encounter: Payer: Self-pay | Admitting: Family Medicine

## 2012-07-25 ENCOUNTER — Telehealth: Payer: Self-pay | Admitting: Family Medicine

## 2012-07-25 NOTE — Telephone Encounter (Signed)
pt wants immunizations faxed to her job, pt stated they have had a pertussis outbreak cb# [phone]

## 2012-07-25 NOTE — Telephone Encounter (Signed)
Spoke with patient and she wanted the immunization report sent to [facility] at [phone], she confirmed that this was a secure fax line. I made her aware the immunization report was being faxed.     KP

## 2013-07-22 ENCOUNTER — Other Ambulatory Visit: Payer: Self-pay | Admitting: Family Medicine

## 2014-01-17 ENCOUNTER — Telehealth: Payer: Self-pay | Admitting: Family Medicine

## 2014-01-17 ENCOUNTER — Ambulatory Visit (INDEPENDENT_AMBULATORY_CARE_PROVIDER_SITE_OTHER): Payer: BC Managed Care – PPO | Admitting: Nurse Practitioner

## 2014-01-17 VITALS — BP 114/76 | HR 104 | Temp 98.4°F | Wt 177.0 lb

## 2014-01-17 DIAGNOSIS — R300 Dysuria: Secondary | ICD-10-CM

## 2014-01-17 LAB — POCT URINALYSIS DIPSTICK
GLUCOSE UA: NEGATIVE
Ketones, UA: NEGATIVE
NITRITE UA: NEGATIVE
Protein, UA: 30
RBC UA: NEGATIVE
Spec Grav, UA: <=1.005
UROBILINOGEN UA: 0.2
pH, UA: 6

## 2014-01-17 MED ORDER — PHENAZOPYRIDINE HCL 200 MG PO TABS: 200.0000 mg | ORAL_TABLET | Freq: Three times a day (TID) | ORAL | Status: DC | PRN

## 2014-01-17 MED ORDER — NITROFURANTOIN MONOHYD MACRO 100 MG PO CAPS: 100.0000 mg | ORAL_CAPSULE | Freq: Two times a day (BID) | ORAL | Status: DC

## 2014-01-17 NOTE — Telephone Encounter (Signed)
Pharmacy called and stated that the antibiotics did not come in. Called and gave verbal to pharmacist. Pharmacist states that patient stormed out.   Called and advised patient that the Rx was called in.

## 2014-01-17 NOTE — Patient Instructions (Addendum)
Start antibiotic. Our office will call if we need to change the antibiotic. Take pyridium to relax bladder, caution: urine tears & sweat will be orange. Do not be alarmed! Sip hydrating fluids (water, juice, colorless soda, decaff tea) every hour to flush kidneys. Let us know if you are not feeling better in a few days.  Urinary Tract Infection Urinary tract infections (UTIs) can develop anywhere along your urinary tract. Your urinary tract is your body's drainage system for removing wastes and extra water. Your urinary tract includes two kidneys, two ureters, a bladder, and a urethra. Your kidneys are a pair of bean-shaped organs. Each kidney is about the size of your fist. They are located below your ribs, one on each side of your spine. CAUSES Infections are caused by microbes, which are microscopic organisms, including fungi, viruses, and bacteria. These organisms are so small that they can only be seen through a microscope. Bacteria are the microbes that most commonly cause UTIs. SYMPTOMS  Symptoms of UTIs may vary by age and gender of the patient and by the location of the infection. Symptoms in young women typically include a frequent and intense urge to urinate and a painful, burning feeling in the bladder or urethra during urination. Older women and men are more likely to be tired, shaky, and weak and have muscle aches and abdominal pain. A fever may mean the infection is in your kidneys. Other symptoms of a kidney infection include pain in your back or sides below the ribs, nausea, and vomiting. DIAGNOSIS To diagnose a UTI, your caregiver will ask you about your symptoms. Your caregiver also will ask to provide a urine sample. The urine sample will be tested for bacteria and white blood cells. White blood cells are made by your body to help fight infection. TREATMENT  Typically, UTIs can be treated with medication. Because most UTIs are caused by a bacterial infection, they usually can be  treated with the use of antibiotics. The choice of antibiotic and length of treatment depend on your symptoms and the type of bacteria causing your infection. HOME CARE INSTRUCTIONS  If you were prescribed antibiotics, take them exactly as your caregiver instructs you. Finish the medication even if you feel better after you have only taken some of the medication.  Drink enough water and fluids to keep your urine clear or pale yellow.  Avoid caffeine, tea, and carbonated beverages. They tend to irritate your bladder.  Empty your bladder often. Avoid holding urine for long periods of time.  Empty your bladder before and after sexual intercourse.  After a bowel movement, women should cleanse from front to back. Use each tissue only once. SEEK MEDICAL CARE IF:   You have back pain.  You develop a fever.  Your symptoms do not begin to resolve within 3 days. SEEK IMMEDIATE MEDICAL CARE IF:   You have severe back pain or lower abdominal pain.  You develop chills.  You have nausea or vomiting.  You have continued burning or discomfort with urination. MAKE SURE YOU:   Understand these instructions.  Will watch your condition.  Will get help right away if you are not doing well or get worse. Document Released: 06/24/2005 Document Revised: 03/15/2012 Document Reviewed: 10/23/2011 [facility] Patient Information 2014 [place].

## 2014-01-17 NOTE — Telephone Encounter (Signed)
Patient called and stated that her pharmacy still does not have her rx for her antibiotic. Patient is at the Pharmacy right now. Please advise.

## 2014-01-17 NOTE — Progress Notes (Signed)
Pre-visit discussion using our clinic review tool. No additional management support is needed unless otherwise documented below in the visit note.  

## 2014-01-17 NOTE — Progress Notes (Signed)
   Subjective:    Patient ID: [name], female    DOB: [birth year], 33 y.o.   MRN: [id number]  Urinary Tract Infection  This is a new problem. The current episode started yesterday. The problem occurs every urination. The problem has been unchanged. The quality of the pain is described as burning. The pain is mild. There has been no fever. Associated symptoms include frequency, hesitancy and urgency. Pertinent negatives include no chills, discharge, flank pain, hematuria, nausea or vomiting. She has tried nothing for the symptoms.      Review of Systems  Constitutional: Negative for fever, chills, activity change, appetite change and fatigue.  HENT: Negative for sore throat.   Gastrointestinal: Negative for nausea, vomiting, abdominal pain and abdominal distention.  Genitourinary: Positive for dysuria, hesitancy, urgency and frequency. Negative for hematuria, flank pain, vaginal discharge and difficulty urinating.       Objective:   Physical Exam  Vitals reviewed. Constitutional: She is oriented to person, place, and time. She appears well-nourished. No distress.  HENT:  Head: Normocephalic and atraumatic.  Eyes: Conjunctivae are normal. Right eye exhibits no discharge. Left eye exhibits no discharge.  Neck: Normal range of motion. Neck supple. No thyromegaly present.  Cardiovascular: Normal rate.   Pulmonary/Chest: Effort normal. No respiratory distress.  Abdominal: Soft. Bowel sounds are normal. She exhibits no distension and no mass. There is no tenderness. There is no rebound and no guarding.  Musculoskeletal: She exhibits no tenderness (no CVA tenderness).  Lymphadenopathy:    She has no cervical adenopathy.  Neurological: She is alert and oriented to person, place, and time.  Skin: Skin is warm and dry.  Psychiatric: She has a normal mood and affect. Her behavior is normal. Thought content normal.          Assessment & Plan:  1. Dysuria - Urine culture -  POCT urinalysis dipstick-POS leuks - nitrofurantoin, macrocrystal-monohydrate, (MACROBID) 100 MG capsule; Take 1 capsule (100 mg total) by mouth 2 (two) times daily.  Dispense: 10 capsule; Refill: 0 - phenazopyridine (PYRIDIUM) 200 MG tablet; Take 1 tablet (200 mg total) by mouth 3 (three) times daily as needed for pain.  Dispense: 6 tablet; Refill: 0 See pt instructions. F/u PRN

## 2014-01-20 LAB — URINE CULTURE

## 2014-02-09 ENCOUNTER — Telehealth: Payer: Self-pay | Admitting: Family Medicine

## 2014-02-09 NOTE — Telephone Encounter (Signed)
Caller name: Nichoel  Relation to pt: self  Call back number: [phone] Pharmacy:  [facility]      Reason for call:   Pt is still have the UTI issues and wants to get something called in to help.  Pt said we could call in the same thing that was prescribed from visit on 01/17/14 for UTI or something else, doesn't matter.  Thanks.

## 2014-02-09 NOTE — Telephone Encounter (Signed)
Advised patient that she would need to be seen. Offered Saturday clinic but doesn't have time. Advised urgent care, states her co-pay is to high. Advised to go to urgent care on [address] because they charge same co-pay as we do. Patient insistent that the provider be ask if there is anything that she could do for her.  Please advise.

## 2014-02-10 ENCOUNTER — Ambulatory Visit: Payer: BC Managed Care – PPO | Admitting: Family Medicine

## 2014-02-12 ENCOUNTER — Ambulatory Visit (INDEPENDENT_AMBULATORY_CARE_PROVIDER_SITE_OTHER): Payer: BC Managed Care – PPO | Admitting: Family Medicine

## 2014-02-12 ENCOUNTER — Encounter: Payer: Self-pay | Admitting: Family Medicine

## 2014-02-12 VITALS — BP 120/76 | HR 84 | Temp 98.3°F | Wt 174.0 lb

## 2014-02-12 DIAGNOSIS — R350 Frequency of micturition: Secondary | ICD-10-CM

## 2014-02-12 LAB — POCT URINALYSIS DIPSTICK
Bilirubin, UA: NEGATIVE
Blood, UA: NEGATIVE
Glucose, UA: NEGATIVE
Ketones, UA: NEGATIVE
Nitrite, UA: NEGATIVE
PROTEIN UA: NEGATIVE
UROBILINOGEN UA: 1
pH, UA: 8.5

## 2014-02-12 MED ORDER — CIPROFLOXACIN HCL 500 MG PO TABS: 500.0000 mg | ORAL_TABLET | Freq: Two times a day (BID) | ORAL | Status: AC

## 2014-02-12 NOTE — Telephone Encounter (Signed)
Patient is scheduled for today at 11:30    KP

## 2014-02-12 NOTE — Progress Notes (Signed)
Pre visit review using our clinic review tool, if applicable. No additional management support is needed unless otherwise documented below in the visit note. 

## 2014-02-12 NOTE — Progress Notes (Signed)
  Subjective:    [name] is a 33 y.o. female who complains of burning with urination, frequency and urgency. She has had symptoms for a few weeks.  Pt seen 4/22 and given macrobid.   Patient also complains of na. Patient denies back pain, congestion, cough, fever, headache, rhinitis, sorethroat, stomach ache and vaginal discharge. Patient does not have a history of recurrent UTI. Patient does not have a history of pyelonephritis.   The following portions of the patient's history were reviewed and updated as appropriate: allergies, current medications, past family history, past medical history, past social history, past surgical history and problem list.  Review of Systems Pertinent items are noted in HPI.    Objective:    BP 120/76  Pulse 84  Temp(Src) 98.3 F (36.8 C) (Oral)  Wt 174 lb (78.926 kg)  SpO2 98% General appearance: alert, cooperative, appears stated age and no distress Abdomen: soft, non-tender; bowel sounds normal; no masses,  no organomegaly  Laboratory:  Urine dipstick: 3+ for leukocyte esterase.   Micro exam: not done.    Assessment:    UTI     Plan:    Medications: ciprofloxacin. Maintain adequate hydration. Follow up if symptoms not improving, and as needed.

## 2014-02-12 NOTE — Addendum Note (Signed)
Addended by: Ewing Schlein on: 02/12/2014 11:57 AM   Modules accepted: Orders

## 2014-02-12 NOTE — Telephone Encounter (Signed)
Urine would need to be rechecked

## 2014-02-12 NOTE — Patient Instructions (Signed)
Urinary Tract Infection  Urinary tract infections (UTIs) can develop anywhere along your urinary tract. Your urinary tract is your body's drainage system for removing wastes and extra water. Your urinary tract includes two kidneys, two ureters, a bladder, and a urethra. Your kidneys are a pair of bean-shaped organs. Each kidney is about the size of your fist. They are located below your ribs, one on each side of your spine.  CAUSES  Infections are caused by microbes, which are microscopic organisms, including fungi, viruses, and bacteria. These organisms are so small that they can only be seen through a microscope. Bacteria are the microbes that most commonly cause UTIs.  SYMPTOMS   Symptoms of UTIs may vary by age and gender of the patient and by the location of the infection. Symptoms in young women typically include a frequent and intense urge to urinate and a painful, burning feeling in the bladder or urethra during urination. Older women and men are more likely to be tired, shaky, and weak and have muscle aches and abdominal pain. A fever may mean the infection is in your kidneys. Other symptoms of a kidney infection include pain in your back or sides below the ribs, nausea, and vomiting.  DIAGNOSIS  To diagnose a UTI, your caregiver will ask you about your symptoms. Your caregiver also will ask to provide a urine sample. The urine sample will be tested for bacteria and white blood cells. White blood cells are made by your body to help fight infection.  TREATMENT   Typically, UTIs can be treated with medication. Because most UTIs are caused by a bacterial infection, they usually can be treated with the use of antibiotics. The choice of antibiotic and length of treatment depend on your symptoms and the type of bacteria causing your infection.  HOME CARE INSTRUCTIONS   If you were prescribed antibiotics, take them exactly as your caregiver instructs you. Finish the medication even if you feel better after you  have only taken some of the medication.   Drink enough water and fluids to keep your urine clear or pale yellow.   Avoid caffeine, tea, and carbonated beverages. They tend to irritate your bladder.   Empty your bladder often. Avoid holding urine for long periods of time.   Empty your bladder before and after sexual intercourse.   After a bowel movement, women should cleanse from front to back. Use each tissue only once.  SEEK MEDICAL CARE IF:    You have back pain.   You develop a fever.   Your symptoms do not begin to resolve within 3 days.  SEEK IMMEDIATE MEDICAL CARE IF:    You have severe back pain or lower abdominal pain.   You develop chills.   You have nausea or vomiting.   You have continued burning or discomfort with urination.  MAKE SURE YOU:    Understand these instructions.   Will watch your condition.   Will get help right away if you are not doing well or get worse.  Document Released: 06/24/2005 Document Revised: 03/15/2012 Document Reviewed: 10/23/2011  [employer] Patient Information 2014 [employer].

## 2014-02-14 LAB — URINE CULTURE: Colony Count: >=100000

## 2014-04-30 ENCOUNTER — Ambulatory Visit: Payer: BC Managed Care – PPO | Admitting: Medical

## 2014-04-30 DIAGNOSIS — Z0289 Encounter for other administrative examinations: Secondary | ICD-10-CM

## 2014-07-06 ENCOUNTER — Encounter: Payer: Self-pay | Admitting: Family Medicine

## 2014-07-06 ENCOUNTER — Ambulatory Visit (INDEPENDENT_AMBULATORY_CARE_PROVIDER_SITE_OTHER): Payer: BC Managed Care – PPO | Admitting: Family Medicine

## 2014-07-06 VITALS — BP 119/76 | HR 99 | Temp 97.9°F | Ht 66.5 in | Wt 156.2 lb

## 2014-07-06 DIAGNOSIS — N3001 Acute cystitis with hematuria: Secondary | ICD-10-CM

## 2014-07-06 DIAGNOSIS — R309 Painful micturition, unspecified: Secondary | ICD-10-CM

## 2014-07-06 LAB — POCT URINALYSIS DIPSTICK
Bilirubin, UA: NEGATIVE
GLUCOSE UA: NEGATIVE
Nitrite, UA: POSITIVE
Urobilinogen, UA: 0.2
pH, UA: 6

## 2014-07-06 MED ORDER — NITROFURANTOIN MONOHYD MACRO 100 MG PO CAPS: 100.0000 mg | ORAL_CAPSULE | Freq: Every day | ORAL | Status: DC | PRN

## 2014-07-06 NOTE — Progress Notes (Signed)
Patient ID: [name], female   DOB: 1980-12-13, 33 y.o.   MRN: [id number] [name] [id number] May 28, 1981 07/06/2014      Progress Note-Follow Up  Subjective  Chief Complaint  Chief Complaint  Patient presents with  . Urinary Tract Infection    odor X 3 days, painful urination along with blood x 2 das    HPI  Patient is a 33 year old female in today for routine medical care. 3 day h/o dysuria, frequency, urgency. No back pain and abdominal pain. Denies CP/palp/SOB/HA/congestion/fevers/GI or GU c/o. Taking meds as prescribed  Past Medical History  Diagnosis Date  . Pre-eclampsia     2010  . Anxiety and depression   . Anxiety     Past Surgical History  Procedure Laterality Date  . Cesarean section      2010    Family History  Problem Relation Age of Onset  . Hypertension Father   . Heart disease Paternal Grandmother   . Pancreatic cancer Paternal Grandfather   . Diabetes Father     Type 2    History   Social History  . Marital Status: Married    Spouse Name: N/A    Number of Children: N/A  . 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 Smoking status: Never Smoker   . Smokeless tobacco: Never Used  . Alcohol Use: No  . Drug Use: No  . Sexual Activity: Yes    Partners: Male   Other Topics Concern  . Not on file   Social History Narrative  . No narrative on file    Current Outpatient Prescriptions on File Prior to Visit  Medication Sig Dispense Refill  . cetirizine (ZYRTEC) 10 MG tablet Take 10 mg by mouth daily.      Marland Kitchen GILDESS FE 1/20 1-20 MG-MCG tablet        No current facility-administered medications on file prior to visit.    Allergies  Allergen Reactions  . Sulfonamide Derivatives     Review of Systems  Review of Systems  Constitutional: Negative for fever and malaise/fatigue.  HENT: Negative for congestion.   Eyes: Negative for discharge.   Respiratory: Negative for shortness of breath.   Cardiovascular: Negative for chest pain, palpitations and leg swelling.  Gastrointestinal: Negative for nausea, abdominal pain and diarrhea.  Genitourinary: Positive for dysuria, urgency, frequency and hematuria.  Musculoskeletal: Negative for falls.  Skin: Negative for rash.  Neurological: Negative for loss of consciousness and headaches.  Endo/Heme/Allergies: Negative for polydipsia.  Psychiatric/Behavioral: Negative for depression and suicidal ideas. The patient is not nervous/anxious and does not have insomnia.     Objective  BP 119/76  Pulse 99  Temp(Src) 97.9 F (36.6 C) (Oral)  Ht 5' 6.5" (1.689 m)  Wt 156 lb 3.2 oz (70.852 kg)  BMI 24.84 kg/m2  SpO2 100%  LMP 06/19/2014  Physical Exam  Physical Exam  Constitutional: She is oriented to person, place, and time and well-developed, well-nourished, and in no distress. No distress.  HENT:  Head: Normocephalic and atraumatic.  Eyes: Conjunctivae are normal.  Neck: Neck supple. No thyromegaly present.  Cardiovascular: Normal rate, regular rhythm and normal heart sounds.   No murmur heard. Pulmonary/Chest: Effort normal and breath sounds normal. She has no wheezes.  Abdominal: She exhibits no distension and no mass.  Musculoskeletal: She exhibits no edema.  Lymphadenopathy:    She has  no cervical adenopathy.  Neurological: She is alert and oriented to person, place, and time.  Skin: Skin is warm and dry. No rash noted. She is not diaphoretic.  Psychiatric: Memory, affect and judgment normal.    Lab Results  Component Value Date   TSH 1.08 05/11/2012   Lab Results  Component Value Date   WBC 4.5 05/11/2012   HGB 12.0 05/11/2012   HCT 36.3 05/11/2012   MCV 90.6 05/11/2012   PLT 273.0 05/11/2012   Lab Results  Component Value Date   CREATININE 0.7 05/11/2012   BUN 14 05/11/2012   NA 137 05/11/2012   K 3.9 05/11/2012   CL 102 05/11/2012   CO2 27 05/11/2012   Lab Results   Component Value Date   ALT 14 05/11/2012   AST 18 05/11/2012   ALKPHOS 61 05/11/2012   BILITOT 0.4 05/11/2012   Lab Results  Component Value Date   CHOL 176 05/11/2012   Lab Results  Component Value Date   HDL 64.50 05/11/2012   Lab Results  Component Value Date   LDLCALC 91 05/11/2012   Lab Results  Component Value Date   TRIG 105.0 05/11/2012   Lab Results  Component Value Date   CHOLHDL 3 05/11/2012     Assessment & Plan  Acute cystitis with hematuria Recurrent 3rd in 6 months, given rx for Macrobid 100 mg po daily prn coitus, dysuria etc. May increase to bid x 3 days if highly symptomatic, call if no better. Discussed preventatioon.

## 2014-07-06 NOTE — Progress Notes (Signed)
Pre visit review using our clinic review tool, if applicable. No additional management support is needed unless otherwise documented below in the visit note. 

## 2014-07-06 NOTE — Assessment & Plan Note (Signed)
Recurrent 3rd in 6 months, given rx for Macrobid 100 mg po daily prn coitus, dysuria etc. May increase to bid x 3 days if highly symptomatic, call if no better. Discussed preventatioon.

## 2014-07-06 NOTE — Patient Instructions (Signed)
Macrobid/Nitrofurantoin 1 tablet daily as needed for dysuria/frequency/coitus/etc If symptomatic increase to 1 tab twice daily x 3 days, if no improvement call for evaluation  Urinary Tract Infection Urinary tract infections (UTIs) can develop anywhere along your urinary tract. Your urinary tract is your body's drainage system for removing wastes and extra water. Your urinary tract includes two kidneys, two ureters, a bladder, and a urethra. Your kidneys are a pair of bean-shaped organs. Each kidney is about the size of your fist. They are located below your ribs, one on each side of your spine. CAUSES Infections are caused by microbes, which are microscopic organisms, including fungi, viruses, and bacteria. These organisms are so small that they can only be seen through a microscope. Bacteria are the microbes that most commonly cause UTIs. SYMPTOMS  Symptoms of UTIs may vary by age and gender of the patient and by the location of the infection. Symptoms in young women typically include a frequent and intense urge to urinate and a painful, burning feeling in the bladder or urethra during urination. Older women and men are more likely to be tired, shaky, and weak and have muscle aches and abdominal pain. A fever may mean the infection is in your kidneys. Other symptoms of a kidney infection include pain in your back or sides below the ribs, nausea, and vomiting. DIAGNOSIS To diagnose a UTI, your caregiver will ask you about your symptoms. Your caregiver also will ask to provide a urine sample. The urine sample will be tested for bacteria and white blood cells. White blood cells are made by your body to help fight infection. TREATMENT  Typically, UTIs can be treated with medication. Because most UTIs are caused by a bacterial infection, they usually can be treated with the use of antibiotics. The choice of antibiotic and length of treatment depend on your symptoms and the type of bacteria causing your  infection. HOME CARE INSTRUCTIONS  If you were prescribed antibiotics, take them exactly as your caregiver instructs you. Finish the medication even if you feel better after you have only taken some of the medication.  Drink enough water and fluids to keep your urine clear or pale yellow.  Avoid caffeine, tea, and carbonated beverages. They tend to irritate your bladder.  Empty your bladder often. Avoid holding urine for long periods of time.  Empty your bladder before and after sexual intercourse.  After a bowel movement, women should cleanse from front to back. Use each tissue only once. SEEK MEDICAL CARE IF:   You have back pain.  You develop a fever.  Your symptoms do not begin to resolve within 3 days. SEEK IMMEDIATE MEDICAL CARE IF:   You have severe back pain or lower abdominal pain.  You develop chills.  You have nausea or vomiting.  You have continued burning or discomfort with urination. MAKE SURE YOU:   Understand these instructions.  Will watch your condition.  Will get help right away if you are not doing well or get worse. Document Released: 06/24/2005 Document Revised: 03/15/2012 Document Reviewed: 10/23/2011 [facility] Patient Information 2015 [place], [place]. This information is not intended to replace advice given to you by your health care provider. Make sure you discuss any questions you have with your health care provider.

## 2014-07-07 LAB — CULTURE, URINE COMPREHENSIVE

## 2015-04-23 ENCOUNTER — Other Ambulatory Visit: Payer: Self-pay

## 2015-04-24 LAB — CYTOLOGY - PAP

## 2016-02-03 ENCOUNTER — Telehealth: Payer: Self-pay | Admitting: *Deleted

## 2016-02-03 ENCOUNTER — Encounter: Payer: Self-pay | Admitting: *Deleted

## 2016-02-03 NOTE — Telephone Encounter (Signed)
Unable to reach patient at time of pre-visit call. Left message for patient to return call when available.  

## 2016-02-03 NOTE — Telephone Encounter (Signed)
Pre-Visit Call completed with patient and chart updated.   Pre-Visit Info documented in Specialty Comments under [employer].    

## 2016-02-04 ENCOUNTER — Ambulatory Visit (INDEPENDENT_AMBULATORY_CARE_PROVIDER_SITE_OTHER): Payer: BC Managed Care – PPO | Admitting: Family Medicine

## 2016-02-04 ENCOUNTER — Encounter: Payer: Self-pay | Admitting: Family Medicine

## 2016-02-04 VITALS — BP 108/62 | HR 67 | Temp 98.5°F | Ht 67.0 in | Wt 164.2 lb

## 2016-02-04 DIAGNOSIS — Z0000 Encounter for general adult medical examination without abnormal findings: Secondary | ICD-10-CM | POA: Diagnosis not present

## 2016-02-04 DIAGNOSIS — F4323 Adjustment disorder with mixed anxiety and depressed mood: Secondary | ICD-10-CM

## 2016-02-04 LAB — POCT URINALYSIS DIPSTICK
Bilirubin, UA: NEGATIVE
Glucose, UA: NEGATIVE
Ketones, UA: NEGATIVE
Leukocytes, UA: NEGATIVE
Nitrite, UA: NEGATIVE
PROTEIN UA: NEGATIVE
UROBILINOGEN UA: 0.2
pH, UA: 6

## 2016-02-04 MED ORDER — ESCITALOPRAM OXALATE 10 MG PO TABS: 10.0000 mg | ORAL_TABLET | Freq: Every day | ORAL | Status: DC

## 2016-02-04 NOTE — Progress Notes (Signed)
Pre visit review using our clinic review tool, if applicable. No additional management support is needed unless otherwise documented below in the visit note. 

## 2016-02-04 NOTE — Progress Notes (Signed)
Subjective:     [name] is a 35 y.o. female and is here for a comprehensive physical exam. The patient reports  She is gettting married in June in [place] and trying to get into PA school for 2020.   She c/o of some hip pain on L after standing all day.   IB sometimes helps.  Social History   Social History  . Marital Status: Married    Spouse Name: N/A  . Number of Children: N/A  . 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 Smoking status: Never Smoker   . Smokeless tobacco: Never Used  . Alcohol Use: No  . Drug Use: Yes    Special: Oxycodone  . Sexual Activity:    Partners: Male   Other Topics Concern  . Not on file   Social History Narrative   Health Maintenance  Topic Date Due  . HIV Screening  01/07/1997  . INFLUENZA VACCINE  04/28/2016  . PAP SMEAR  04/22/2018  . TETANUS/TDAP  05/11/2022    The following portions of the patient's history were reviewed and updated as appropriate:  She  has a past medical history of Pre-eclampsia; Anxiety and depression; and Anxiety. She  does not have any pertinent problems on file. She  has past surgical history that includes Cesarean section. Her family history includes Diabetes in her father; Heart disease in her paternal grandmother; Hypertension in her father; Pancreatic cancer in her paternal grandfather. She  reports that she has never smoked. She has never used smokeless tobacco. She reports that she uses illicit drugs (Oxycodone). She reports that she does not drink alcohol. She has a current medication list which includes the following prescription(s): cetirizine and escitalopram. Current Outpatient Prescriptions on File Prior to Visit  Medication Sig Dispense Refill  . cetirizine (ZYRTEC) 10 MG tablet Take 10 mg by mouth daily.     No current facility-administered medications on file prior to visit.   She is allergic to  sulfonamide derivatives..  Review of Systems Review of Systems  Constitutional: Negative for activity change, appetite change and fatigue.  HENT: Negative for hearing loss, congestion, tinnitus and ear discharge.  dentist q37m Eyes: Negative for visual disturbance (see optho q1y -- vision corrected to 20/20 with glasses).  Respiratory: Negative for cough, chest tightness and shortness of breath.   Cardiovascular: Negative for chest pain, palpitations and leg swelling.  Gastrointestinal: Negative for abdominal pain, diarrhea, constipation and abdominal distention.  Genitourinary: Negative for urgency, frequency, decreased urine volume and difficulty urinating.  Musculoskeletal: Negative for back pain, arthralgias and gait problem.  Skin: Negative for color change, pallor and rash.  Neurological: Negative for dizziness, light-headedness, numbness and headaches.  Hematological: Negative for adenopathy. Does not bruise/bleed easily.  Psychiatric/Behavioral: Negative for suicidal ideas, confusion, sleep disturbance, self-injury, dysphoric mood, decreased concentration and agitation.       Objective:    BP 108/62 mmHg  Pulse 67  Temp(Src) 98.5 F (36.9 C) (Oral)  Ht 5\' 7"  (1.702 m)  Wt 164 lb 3.2 oz (74.481 kg)  BMI 25.71 kg/m2  SpO2 98% General appearance: alert, cooperative, appears stated age and no distress Head: Normocephalic, without obvious abnormality, atraumatic Eyes: conjunctivae/corneas clear. PERRL, EOM's intact. Fundi benign. Ears: normal TM's and external ear canals both ears Nose: Nares normal. Septum midline. Mucosa normal. No drainage or sinus tenderness. Throat: lips, mucosa, and tongue normal; teeth  and gums normal Neck: no adenopathy, no carotid bruit, no JVD, supple, symmetrical, trachea midline and thyroid not enlarged, symmetric, no tenderness/mass/nodules Back: symmetric, no curvature. ROM normal. No CVA tenderness. Lungs: clear to auscultation  bilaterally Breasts: gyn Heart: regular rate and rhythm, S1, S2 normal, no murmur, click, rub or gallop Abdomen: soft, non-tender; bowel sounds normal; no masses,  no organomegaly Pelvic: deferred--gyn Extremities: extremities normal, atraumatic, no cyanosis or edema Pulses: 2+ and symmetric Skin: Skin color, texture, turgor normal. No rashes or lesions Lymph nodes: Cervical, supraclavicular, and axillary nodes normal. Neurologic: Alert and oriented X 3, normal strength and tone. Normal symmetric reflexes. Normal coordination and gait Psych- no depression      Assessment:    Healthy female exam.      Plan:    ghm utd Check labs See After Visit Summary for Counseling Recommendations    1. Preventative health care   - Comprehensive metabolic panel - CBC with Differential/Platelet - Lipid panel - POCT urinalysis dipstick - TSH  2. Adjustment disorder with mixed anxiety and depressed mood   - escitalopram (LEXAPRO) 10 MG tablet; Take 1 tablet (10 mg total) by mouth daily.  Dispense: 30 tablet; Refill: 2 - Comprehensive metabolic panel - CBC with Differential/Platelet - TSH

## 2016-02-04 NOTE — Patient Instructions (Addendum)
Preventive Care for Adults, Female A healthy lifestyle and preventive care can promote health and wellness. Preventive health guidelines for women include the following key practices.  A routine yearly physical is a good way to check with your health care provider about your health and preventive screening. It is a chance to share any concerns and updates on your health and to receive a thorough exam.  Visit your dentist for a routine exam and preventive care every 6 months. Brush your teeth twice a day and floss once a day. Good oral hygiene prevents tooth decay and gum disease.  The frequency of eye exams is based on your age, health, family medical history, use of contact lenses, and other factors. Follow your health care provider's recommendations for frequency of eye exams.  Eat a healthy diet. Foods like vegetables, fruits, whole grains, low-fat dairy products, and lean protein foods contain the nutrients you need without too many calories. Decrease your intake of foods high in solid fats, added sugars, and salt. Eat the right amount of calories for you.Get information about a proper diet from your health care provider, if necessary.  Regular physical exercise is one of the most important things you can do for your health. Most adults should get at least 150 minutes of moderate-intensity exercise (any activity that increases your heart rate and causes you to sweat) each week. In addition, most adults need muscle-strengthening exercises on 2 or more days a week.  Maintain a healthy weight. The body mass index (BMI) is a screening tool to identify possible weight problems. It provides an estimate of body fat based on height and weight. Your health care provider can find your BMI and can help you achieve or maintain a healthy weight.For adults 20 years and older:  A BMI below 18.5 is considered underweight.  A BMI of 18.5 to 24.9 is normal.  A BMI of 25 to 29.9 is considered overweight.  A  BMI of 30 and above is considered obese.  Maintain normal blood lipids and cholesterol levels by exercising and minimizing your intake of saturated fat. Eat a balanced diet with plenty of fruit and vegetables. Blood tests for lipids and cholesterol should begin at age 45 and be repeated every 5 years. If your lipid or cholesterol levels are high, you are over 50, or you are at high risk for heart disease, you may need your cholesterol levels checked more frequently.Ongoing high lipid and cholesterol levels should be treated with medicines if diet and exercise are not working.  If you smoke, find out from your health care provider how to quit. If you do not use tobacco, do not start.  Lung cancer screening is recommended for adults aged 45-80 years who are at high risk for developing lung cancer because of a history of smoking. A yearly low-dose CT scan of the lungs is recommended for people who have at least a 30-pack-year history of smoking and are a current smoker or have quit within the past 15 years. A pack year of smoking is smoking an average of 1 pack of cigarettes a day for 1 year (for example: 1 pack a day for 30 years or 2 packs a day for 15 years). Yearly screening should continue until the smoker has stopped smoking for at least 15 years. Yearly screening should be stopped for people who develop a health problem that would prevent them from having lung cancer treatment.  If you are pregnant, do not drink alcohol. If you are  breastfeeding, be very cautious about drinking alcohol. If you are not pregnant and choose to drink alcohol, do not have more than 1 drink per day. One drink is considered to be 12 ounces (355 mL) of beer, 5 ounces (148 mL) of wine, or 1.5 ounces (44 mL) of liquor.  Avoid use of street drugs. Do not share needles with anyone. Ask for help if you need support or instructions about stopping the use of drugs.  High blood pressure causes heart disease and increases the risk  of stroke. Your blood pressure should be checked at least every 1 to 2 years. Ongoing high blood pressure should be treated with medicines if weight loss and exercise do not work.  If you are 55-79 years old, ask your health care provider if you should take aspirin to prevent strokes.  Diabetes screening is done by taking a blood sample to check your blood glucose level after you have not eaten for a certain period of time (fasting). If you are not overweight and you do not have risk factors for diabetes, you should be screened once every 3 years starting at age 45. If you are overweight or obese and you are 40-70 years of age, you should be screened for diabetes every year as part of your cardiovascular risk assessment.  Breast cancer screening is essential preventive care for women. You should practice "breast self-awareness." This means understanding the normal appearance and feel of your breasts and may include breast self-examination. Any changes detected, no matter how small, should be reported to a health care provider. Women in their 20s and 30s should have a clinical breast exam (CBE) by a health care provider as part of a regular health exam every 1 to 3 years. After age 40, women should have a CBE every year. Starting at age 40, women should consider having a mammogram (breast X-ray test) every year. Women who have a family history of breast cancer should talk to their health care provider about genetic screening. Women at a high risk of breast cancer should talk to their health care providers about having an MRI and a mammogram every year.  Breast cancer gene (BRCA)-related cancer risk assessment is recommended for women who have family members with BRCA-related cancers. BRCA-related cancers include breast, ovarian, tubal, and peritoneal cancers. Having family members with these cancers may be associated with an increased risk for harmful changes (mutations) in the breast cancer genes BRCA1 and  BRCA2. Results of the assessment will determine the need for genetic counseling and BRCA1 and BRCA2 testing.  Your health care provider may recommend that you be screened regularly for cancer of the pelvic organs (ovaries, uterus, and vagina). This screening involves a pelvic examination, including checking for microscopic changes to the surface of your cervix (Pap test). You may be encouraged to have this screening done every 3 years, beginning at age 21.  For women ages 30-65, health care providers may recommend pelvic exams and Pap testing every 3 years, or they may recommend the Pap and pelvic exam, combined with testing for human papilloma virus (HPV), every 5 years. Some types of HPV increase your risk of cervical cancer. Testing for HPV may also be done on women of any age with unclear Pap test results.  Other health care providers may not recommend any screening for nonpregnant women who are considered low risk for pelvic cancer and who do not have symptoms. Ask your health care provider if a screening pelvic exam is right for   you.  If you have had past treatment for cervical cancer or a condition that could lead to cancer, you need Pap tests and screening for cancer for at least 20 years after your treatment. If Pap tests have been discontinued, your risk factors (such as having a new sexual partner) need to be reassessed to determine if screening should resume. Some women have medical problems that increase the chance of getting cervical cancer. In these cases, your health care provider may recommend more frequent screening and Pap tests.  Colorectal cancer can be detected and often prevented. Most routine colorectal cancer screening begins at the age of 50 years and continues through age 75 years. However, your health care provider may recommend screening at an earlier age if you have risk factors for colon cancer. On a yearly basis, your health care provider may provide home test kits to check  for hidden blood in the stool. Use of a small camera at the end of a tube, to directly examine the colon (sigmoidoscopy or colonoscopy), can detect the earliest forms of colorectal cancer. Talk to your health care provider about this at age 50, when routine screening begins. Direct exam of the colon should be repeated every 5-10 years through age 75 years, unless early forms of precancerous polyps or small growths are found.  People who are at an increased risk for hepatitis B should be screened for this virus. You are considered at high risk for hepatitis B if:  You were born in a country where hepatitis B occurs often. Talk with your health care provider about which countries are considered high risk.  Your parents were born in a high-risk country and you have not received a shot to protect against hepatitis B (hepatitis B vaccine).  You have HIV or AIDS.  You use needles to inject street drugs.  You live with, or have sex with, someone who has hepatitis B.  You get hemodialysis treatment.  You take certain medicines for conditions like cancer, organ transplantation, and autoimmune conditions.  Hepatitis C blood testing is recommended for all people born from 1945 through 1965 and any individual with known risks for hepatitis C.  Practice safe sex. Use condoms and avoid high-risk sexual practices to reduce the spread of sexually transmitted infections (STIs). STIs include gonorrhea, chlamydia, syphilis, trichomonas, herpes, HPV, and human immunodeficiency virus (HIV). Herpes, HIV, and HPV are viral illnesses that have no cure. They can result in disability, cancer, and death.  You should be screened for sexually transmitted illnesses (STIs) including gonorrhea and chlamydia if:  You are sexually active and are younger than 24 years.  You are older than 24 years and your health care provider tells you that you are at risk for this type of infection.  Your sexual activity has changed  since you were last screened and you are at an increased risk for chlamydia or gonorrhea. Ask your health care provider if you are at risk.  If you are at risk of being infected with HIV, it is recommended that you take a prescription medicine daily to prevent HIV infection. This is called preexposure prophylaxis (PrEP). You are considered at risk if:  You are sexually active and do not regularly use condoms or know the HIV status of your partner(s).  You take drugs by injection.  You are sexually active with a partner who has HIV.  Talk with your health care provider about whether you are at high risk of being infected with HIV. If   you choose to begin PrEP, you should first be tested for HIV. You should then be tested every 3 months for as long as you are taking PrEP.  Osteoporosis is a disease in which the bones lose minerals and strength with aging. This can result in serious bone fractures or breaks. The risk of osteoporosis can be identified using a bone density scan. Women ages 67 years and over and women at risk for fractures or osteoporosis should discuss screening with their health care providers. Ask your health care provider whether you should take a calcium supplement or vitamin D to reduce the rate of osteoporosis.  Menopause can be associated with physical symptoms and risks. Hormone replacement therapy is available to decrease symptoms and risks. You should talk to your health care provider about whether hormone replacement therapy is right for you.  Use sunscreen. Apply sunscreen liberally and repeatedly throughout the day. You should seek shade when your shadow is shorter than you. Protect yourself by wearing long sleeves, pants, a wide-brimmed hat, and sunglasses year round, whenever you are outdoors.  Once a month, do a whole body skin exam, using a mirror to look at the skin on your back. Tell your health care provider of new moles, moles that have irregular borders, moles that  are larger than a pencil eraser, or moles that have changed in shape or color.  Stay current with required vaccines (immunizations).  Influenza vaccine. All adults should be immunized every year.  Tetanus, diphtheria, and acellular pertussis (Td, Tdap) vaccine. Pregnant women should receive 1 dose of Tdap vaccine during each pregnancy. The dose should be obtained regardless of the length of time since the last dose. Immunization is preferred during the 27th-36th week of gestation. An adult who has not previously received Tdap or who does not know her vaccine status should receive 1 dose of Tdap. This initial dose should be followed by tetanus and diphtheria toxoids (Td) booster doses every 10 years. Adults with an unknown or incomplete history of completing a 3-dose immunization series with Td-containing vaccines should begin or complete a primary immunization series including a Tdap dose. Adults should receive a Td booster every 10 years.  Varicella vaccine. An adult without evidence of immunity to varicella should receive 2 doses or a second dose if she has previously received 1 dose. Pregnant females who do not have evidence of immunity should receive the first dose after pregnancy. This first dose should be obtained before leaving the health care facility. The second dose should be obtained 4-8 weeks after the first dose.  Human papillomavirus (HPV) vaccine. Females aged 13-26 years who have not received the vaccine previously should obtain the 3-dose series. The vaccine is not recommended for use in pregnant females. However, pregnancy testing is not needed before receiving a dose. If a female is found to be pregnant after receiving a dose, no treatment is needed. In that case, the remaining doses should be delayed until after the pregnancy. Immunization is recommended for any person with an immunocompromised condition through the age of 61 years if she did not get any or all doses earlier. During the  3-dose series, the second dose should be obtained 4-8 weeks after the first dose. The third dose should be obtained 24 weeks after the first dose and 16 weeks after the second dose.  Zoster vaccine. One dose is recommended for adults aged 30 years or older unless certain conditions are present.  Measles, mumps, and rubella (MMR) vaccine. Adults born  before 1957 generally are considered immune to measles and mumps. Adults born in 1957 or later should have 1 or more doses of MMR vaccine unless there is a contraindication to the vaccine or there is laboratory evidence of immunity to each of the three diseases. A routine second dose of MMR vaccine should be obtained at least 28 days after the first dose for students attending postsecondary schools, health care workers, or international travelers. People who received inactivated measles vaccine or an unknown type of measles vaccine during 1963-1967 should receive 2 doses of MMR vaccine. People who received inactivated mumps vaccine or an unknown type of mumps vaccine before 1979 and are at high risk for mumps infection should consider immunization with 2 doses of MMR vaccine. For females of childbearing age, rubella immunity should be determined. If there is no evidence of immunity, females who are not pregnant should be vaccinated. If there is no evidence of immunity, females who are pregnant should delay immunization until after pregnancy. Unvaccinated health care workers born before 1957 who lack laboratory evidence of measles, mumps, or rubella immunity or laboratory confirmation of disease should consider measles and mumps immunization with 2 doses of MMR vaccine or rubella immunization with 1 dose of MMR vaccine.  Pneumococcal 13-valent conjugate (PCV13) vaccine. When indicated, a person who is uncertain of his immunization history and has no record of immunization should receive the PCV13 vaccine. All adults 65 years of age and older should receive this  vaccine. An adult aged 19 years or older who has certain medical conditions and has not been previously immunized should receive 1 dose of PCV13 vaccine. This PCV13 should be followed with a dose of pneumococcal polysaccharide (PPSV23) vaccine. Adults who are at high risk for pneumococcal disease should obtain the PPSV23 vaccine at least 8 weeks after the dose of PCV13 vaccine. Adults older than 34 years of age who have normal immune system function should obtain the PPSV23 vaccine dose at least 1 year after the dose of PCV13 vaccine.  Pneumococcal polysaccharide (PPSV23) vaccine. When PCV13 is also indicated, PCV13 should be obtained first. All adults aged 65 years and older should be immunized. An adult younger than age 65 years who has certain medical conditions should be immunized. Any person who resides in a nursing home or long-term care facility should be immunized. An adult smoker should be immunized. People with an immunocompromised condition and certain other conditions should receive both PCV13 and PPSV23 vaccines. People with human immunodeficiency virus (HIV) infection should be immunized as soon as possible after diagnosis. Immunization during chemotherapy or radiation therapy should be avoided. Routine use of PPSV23 vaccine is not recommended for American Indians, [place] Natives, or people younger than 65 years unless there are medical conditions that require PPSV23 vaccine. When indicated, people who have unknown immunization and have no record of immunization should receive PPSV23 vaccine. One-time revaccination 5 years after the first dose of PPSV23 is recommended for people aged 19-64 years who have chronic kidney failure, nephrotic syndrome, asplenia, or immunocompromised conditions. People who received 1-2 doses of PPSV23 before age 65 years should receive another dose of PPSV23 vaccine at age 65 years or later if at least 5 years have passed since the previous dose. Doses of PPSV23 are not  needed for people immunized with PPSV23 at or after age 65 years.  Meningococcal vaccine. Adults with asplenia or persistent complement component deficiencies should receive 2 doses of quadrivalent meningococcal conjugate (MenACWY-D) vaccine. The doses should be obtained   at least 2 months apart. Microbiologists working with certain meningococcal bacteria, [place] recruits, people at risk during an outbreak, and people who travel to or live in countries with a high rate of meningitis should be immunized. A first-year college student up through age 34 years who is living in a residence hall should receive a dose if she did not receive a dose on or after her 16th birthday. Adults who have certain high-risk conditions should receive one or more doses of vaccine.  Hepatitis A vaccine. Adults who wish to be protected from this disease, have certain high-risk conditions, work with hepatitis A-infected animals, work in hepatitis A research labs, or travel to or work in countries with a high rate of hepatitis A should be immunized. Adults who were previously unvaccinated and who anticipate close contact with an international adoptee during the first 60 days after arrival in the [place] from a country with a high rate of hepatitis A should be immunized.  Hepatitis B vaccine. Adults who wish to be protected from this disease, have certain high-risk conditions, may be exposed to blood or other infectious body fluids, are household contacts or sex partners of hepatitis B positive people, are clients or workers in certain care facilities, or travel to or work in countries with a high rate of hepatitis B should be immunized.  Haemophilus influenzae type b (Hib) vaccine. A previously unvaccinated person with asplenia or sickle cell disease or having a scheduled splenectomy should receive 1 dose of Hib vaccine. Regardless of previous immunization, a recipient of a hematopoietic stem cell transplant should receive a  3-dose series 6-12 months after her successful transplant. Hib vaccine is not recommended for adults with HIV infection. Preventive Services / Frequency Ages 35 to 4 years  Blood pressure check.** / Every 3-5 years.  Lipid and cholesterol check.** / Every 5 years beginning at age 60.  Clinical breast exam.** / Every 3 years for women in their 71s and 10s.  BRCA-related cancer risk assessment.** / For women who have family members with a BRCA-related cancer (breast, ovarian, tubal, or peritoneal cancers).  Pap test.** / Every 2 years from ages 76 through 26. Every 3 years starting at age 61 through age 76 or 93 with a history of 3 consecutive normal Pap tests.  HPV screening.** / Every 3 years from ages 37 through ages 60 to 51 with a history of 3 consecutive normal Pap tests.  Hepatitis C blood test.** / For any individual with known risks for hepatitis C.  Skin self-exam. / Monthly.  Influenza vaccine. / Every year.  Tetanus, diphtheria, and acellular pertussis (Tdap, Td) vaccine.** / Consult your health care provider. Pregnant women should receive 1 dose of Tdap vaccine during each pregnancy. 1 dose of Td every 10 years.  Varicella vaccine.** / Consult your health care provider. Pregnant females who do not have evidence of immunity should receive the first dose after pregnancy.  HPV vaccine. / 3 doses over 6 months, if 93 and younger. The vaccine is not recommended for use in pregnant females. However, pregnancy testing is not needed before receiving a dose.  Measles, mumps, rubella (MMR) vaccine.** / You need at least 1 dose of MMR if you were born in 1957 or later. You may also need a 2nd dose. For females of childbearing age, rubella immunity should be determined. If there is no evidence of immunity, females who are not pregnant should be vaccinated. If there is no evidence of immunity, females who are  pregnant should delay immunization until after pregnancy.  Pneumococcal  13-valent conjugate (PCV13) vaccine.** / Consult your health care provider.  Pneumococcal polysaccharide (PPSV23) vaccine.** / 1 to 2 doses if you smoke cigarettes or if you have certain conditions.  Meningococcal vaccine.** / 1 dose if you are age 68 to 8 years and a Market researcher living in a residence hall, or have one of several medical conditions, you need to get vaccinated against meningococcal disease. You may also need additional booster doses.  Hepatitis A vaccine.** / Consult your health care provider.  Hepatitis B vaccine.** / Consult your health care provider.  Haemophilus influenzae type b (Hib) vaccine.** / Consult your health care provider. Ages 7 to 53 years  Blood pressure check.** / Every year.  Lipid and cholesterol check.** / Every 5 years beginning at age 25 years.  Lung cancer screening. / Every year if you are aged 11-80 years and have a 30-pack-year history of smoking and currently smoke or have quit within the past 15 years. Yearly screening is stopped once you have quit smoking for at least 15 years or develop a health problem that would prevent you from having lung cancer treatment.  Clinical breast exam.** / Every year after age 48 years.  BRCA-related cancer risk assessment.** / For women who have family members with a BRCA-related cancer (breast, ovarian, tubal, or peritoneal cancers).  Mammogram.** / Every year beginning at age 41 years and continuing for as long as you are in good health. Consult with your health care provider.  Pap test.** / Every 3 years starting at age 65 years through age 37 or 70 years with a history of 3 consecutive normal Pap tests.  HPV screening.** / Every 3 years from ages 72 years through ages 60 to 40 years with a history of 3 consecutive normal Pap tests.  Fecal occult blood test (FOBT) of stool. / Every year beginning at age 21 years and continuing until age 5 years. You may not need to do this test if you get  a colonoscopy every 10 years.  Flexible sigmoidoscopy or colonoscopy.** / Every 5 years for a flexible sigmoidoscopy or every 10 years for a colonoscopy beginning at age 35 years and continuing until age 48 years.  Hepatitis C blood test.** / For all people born from [birth year] through 1965 and any individual with known risks for hepatitis C.  Skin self-exam. / Monthly.  Influenza vaccine. / Every year.  Tetanus, diphtheria, and acellular pertussis (Tdap/Td) vaccine.** / Consult your health care provider. Pregnant women should receive 1 dose of Tdap vaccine during each pregnancy. 1 dose of Td every 10 years.  Varicella vaccine.** / Consult your health care provider. Pregnant females who do not have evidence of immunity should receive the first dose after pregnancy.  Zoster vaccine.** / 1 dose for adults aged 30 years or older.  Measles, mumps, rubella (MMR) vaccine.** / You need at least 1 dose of MMR if you were born in 1957 or later. You may also need a second dose. For females of childbearing age, rubella immunity should be determined. If there is no evidence of immunity, females who are not pregnant should be vaccinated. If there is no evidence of immunity, females who are pregnant should delay immunization until after pregnancy.  Pneumococcal 13-valent conjugate (PCV13) vaccine.** / Consult your health care provider.  Pneumococcal polysaccharide (PPSV23) vaccine.** / 1 to 2 doses if you smoke cigarettes or if you have certain conditions.  Meningococcal vaccine.** /  Consult your health care provider.  Hepatitis A vaccine.** / Consult your health care provider.  Hepatitis B vaccine.** / Consult your health care provider.  Haemophilus influenzae type b (Hib) vaccine.** / Consult your health care provider. Ages 23 years and over  Blood pressure check.** / Every year.  Lipid and cholesterol check.** / Every 5 years beginning at age 53 years.  Lung cancer screening. / Every year if you  are aged 48-80 years and have a 30-pack-year history of smoking and currently smoke or have quit within the past 15 years. Yearly screening is stopped once you have quit smoking for at least 15 years or develop a health problem that would prevent you from having lung cancer treatment.  Clinical breast exam.** / Every year after age 3 years.  BRCA-related cancer risk assessment.** / For women who have family members with a BRCA-related cancer (breast, ovarian, tubal, or peritoneal cancers).  Mammogram.** / Every year beginning at age 67 years and continuing for as long as you are in good health. Consult with your health care provider.  Pap test.** / Every 3 years starting at age 48 years through age 73 or 48 years with 3 consecutive normal Pap tests. Testing can be stopped between 65 and 70 years with 3 consecutive normal Pap tests and no abnormal Pap or HPV tests in the past 10 years.  HPV screening.** / Every 3 years from ages 16 years through ages 55 or 68 years with a history of 3 consecutive normal Pap tests. Testing can be stopped between 65 and 70 years with 3 consecutive normal Pap tests and no abnormal Pap or HPV tests in the past 10 years.  Fecal occult blood test (FOBT) of stool. / Every year beginning at age 54 years and continuing until age 67 years. You may not need to do this test if you get a colonoscopy every 10 years.  Flexible sigmoidoscopy or colonoscopy.** / Every 5 years for a flexible sigmoidoscopy or every 10 years for a colonoscopy beginning at age 96 years and continuing until age 24 years.  Hepatitis C blood test.** / For all people born from [birth year] through 1964 and any individual with known risks for hepatitis C.  Osteoporosis screening.** / A one-time screening for women ages 81 years and over and women at risk for fractures or osteoporosis.  Skin self-exam. / Monthly.  Influenza vaccine. / Every year.  Tetanus, diphtheria, and acellular pertussis (Tdap/Td)  vaccine.** / 1 dose of Td every 10 years.  Varicella vaccine.** / Consult your health care provider.  Zoster vaccine.** / 1 dose for adults aged 69 years or older.  Pneumococcal 13-valent conjugate (PCV13) vaccine.** / Consult your health care provider.  Pneumococcal polysaccharide (PPSV23) vaccine.** / 1 dose for all adults aged 69 years and older.  Meningococcal vaccine.** / Consult your health care provider.  Hepatitis A vaccine.** / Consult your health care provider.  Hepatitis B vaccine.** / Consult your health care provider.  Haemophilus influenzae type b (Hib) vaccine.** / Consult your health care provider. ** Family history and personal history of risk and conditions may change your health care provider's recommendations.   This information is not intended to replace advice given to you by your health care provider. Make sure you discuss any questions you have with your health care provider.   Document Released: 11/10/2001 Document Revised: 10/05/2014 Document Reviewed: 02/09/2011 Elsevier Interactive Patient Education 2016 Elsevier Inc.    Generic Hip Exercises RANGE OF MOTION (ROM) AND STRETCHING  EXERCISES  These exercises may help you when beginning to rehabilitate your injury. Doing them too aggressively can worsen your condition. Complete them slowly and gently. Your symptoms may resolve with or without further involvement from your physician, physical therapist or athletic trainer. While completing these exercises, remember:  Restoring tissue flexibility helps normal motion to return to the joints. This allows healthier, less painful movement and activity. An effective stretch should be held for at least 30 seconds. A stretch should never be painful. You should only feel a gentle lengthening or release in the stretched tissue. If these stretches worsen your symptoms even when done gently, consult your physician, physical therapist or athletic trainer. STRETCH -  Hamstrings, Supine  Lie on your back. Loop a belt or towel over the ball of your right / left foot. Straighten your right / left knee and slowly pull on the belt to raise your leg. Do not allow the right / left knee to bend. Keep your opposite leg flat on the floor. Raise the leg until you feel a gentle stretch behind your right / left knee or thigh. Hold this position for __________ seconds. Repeat __________ times. Complete this stretch __________ times per day.  STRETCH - Hip Rotators  Lie on your back on a firm surface. Grasp your right / left knee with your right / left hand and your ankle with your opposite hand. Keeping your hips and shoulders firmly planted, gently pull your right / left knee and rotate your lower leg toward your opposite shoulder until you feel a stretch in your buttocks. Hold this stretch for __________ seconds. Repeat this stretch __________ times. Complete this stretch __________ times per day. STRETCH - Hamstrings/Adductors, V-Sit  Sit on the floor with your legs extended in a large "V," keeping your knees straight. With your head and chest upright, bend at your waist reaching for your right foot to stretch your left adductors. You should feel a stretch in your left inner thigh. Hold for __________ seconds. Return to the upright position to relax your leg muscles. Continuing to keep your chest upright, bend straight forward at your waist to stretch your hamstrings. You should feel a stretch behind both of your thighs and/or knees. Hold for __________ seconds. Return to the upright position to relax your leg muscles. Repeat steps 2 through 4 for opposite leg. Repeat __________ times. Complete this exercise __________ times per day.  STRETCHING - Hip Flexors, Lunge Half kneel with your right / left knee on the floor and your opposite knee bent and directly over your ankle. Keep good posture with your head over your shoulders. Tighten your buttocks to point your  tailbone downward; this will prevent your back from arching too much. You should feel a gentle stretch in the front of your thigh and/or hip. If you do not feel any resistance, slightly slide your opposite foot forward and then slowly lunge forward so your knee once again lines up over your ankle. Be sure your tailbone remains pointed downward. Hold this stretch for __________ seconds. Repeat __________ times. Complete this stretch __________ times per day. STRENGTHENING EXERCISES These exercises may help you when beginning to rehabilitate your injury. They may resolve your symptoms with or without further involvement from your physician, physical therapist or athletic trainer. While completing these exercises, remember:  Muscles can gain both the endurance and the strength needed for everyday activities through controlled exercises. Complete these exercises as instructed by your physician, physical therapist or athletic trainer. Progress  the resistance and repetitions only as guided. You may experience muscle soreness or fatigue, but the pain or discomfort you are trying to eliminate should never worsen during these exercises. If this pain does worsen, stop and make certain you are following the directions exactly. If the pain is still present after adjustments, discontinue the exercise until you can discuss the trouble with your clinician. STRENGTH - Hip Extensors, Bridge  Lie on your back on a firm surface. Bend your knees and place your feet flat on the floor. Tighten your buttocks muscles and lift your bottom off the floor until your trunk is level with your thighs. You should feel the muscles in your buttocks and back of your thighs working. If you do not feel these muscles, slide your feet 1-2 inches further away from your buttocks. Hold this position for __________ seconds. Slowly lower your hips to the starting position and allow your buttock muscles relax completely before beginning the next  repetition. If this exercise is too easy, you may cross your arms over your chest. Repeat __________ times. Complete this exercise __________ times per day.  STRENGTH - Hip Abductors, Straight Leg Raises  Be aware of your form throughout the entire exercise so that you exercise the correct muscles. Sloppy form means that you are not strengthening the correct muscles. Lie on your side so that your head, shoulders, knee and hip line up. You may bend your lower knee to help maintain your balance. Your right / left leg should be on top. Roll your hips slightly forward, so that your hips are stacked directly over each other and your right / left knee is facing forward. Lift your top leg up 4-6 inches, leading with your heel. Be sure that your foot does not drift forward or that your knee does not roll toward the ceiling. Hold this position for __________ seconds. You should feel the muscles in your outer hip lifting (you may not notice this until your leg begins to tire). Slowly lower your leg to the starting position. Allow the muscles to fully relax before beginning the next repetition. Repeat __________ times. Complete this exercise __________ times per day.  STRENGTH - Hip Adductors, Straight Leg Raises  Lie on your side so that your head, shoulders, knee and hip line up. You may place your upper foot in front to help maintain your balance. Your right / left leg should be on the bottom. Roll your hips slightly forward, so that your hips are stacked directly over each other and your right / left knee is facing forward. Tense the muscles in your inner thigh and lift your bottom leg 4-6 inches. Hold this position for __________ seconds. Slowly lower your leg to the starting position. Allow the muscles to fully relax before beginning the next repetition. Repeat __________ times. Complete this exercise __________ times per day.  STRENGTH - Quadriceps, Straight Leg Raises  Quality counts! Watch for signs  that the quadriceps muscle is working to insure you are strengthening the correct muscles and not "cheating" by substituting with healthier muscles. Lay on your back with your right / left leg extended and your opposite knee bent. Tense the muscles in the front of your right / left thigh. You should see either your knee cap slide up or increased dimpling just above the knee. Your thigh may even quiver. Tighten these muscles even more and raise your leg 4 to 6 inches off the floor. Hold for right / left seconds. Keeping these muscles  tense, lower your leg. Relax the muscles slowly and completely in between each repetition. Repeat __________ times. Complete this exercise __________ times per day.  STRENGTH - Hip Abductors, Standing Tie one end of a rubber exercise band/tubing to a secure surface (table, pole) and tie a loop at the other end. Place the loop around your right / left ankle. Keeping your ankle with the band directly opposite of the secured end, step away until there is tension in the tube/band. Hold onto a chair as needed for balance. Keeping your back upright, your shoulders over your hips, and your toes pointing forward, lift your right / left leg out to your side. Be sure to lift your leg with your hip muscles. Do not "throw" your leg or tip your body to lift your leg. Slowly and with control, return to the starting position. Repeat exercise __________ times. Complete this exercise __________ times per day.  STRENGTH - Quadriceps, Squats Stand in a door frame so that your feet and knees are in line with the frame. Use your hands for balance, not support, on the frame. Slowly lower your weight, bending at the hips and knees. Keep your lower legs upright so that they are parallel with the door frame. Squat only within the range that does not increase your knee pain. Never let your hips drop below your knees. Slowly return upright, pushing with your legs, not pulling with your hands.    This information is not intended to replace advice given to you by your health care provider. Make sure you discuss any questions you have with your health care provider.   Document Released: 10/02/2005 Document Revised: 10/05/2014 Document Reviewed: 12/27/2008 Elsevier Interactive Patient Education 2016 Elsevier Inc. Hip Pain Your hip is the joint between your upper legs and your lower pelvis. The bones, cartilage, tendons, and muscles of your hip joint perform a lot of work each day supporting your body weight and allowing you to move around. Hip pain can range from a minor ache to severe pain in one or both of your hips. Pain may be felt on the inside of the hip joint near the groin, or the outside near the buttocks and upper thigh. You may have swelling or stiffness as well.  HOME CARE INSTRUCTIONS   Take medicines only as directed by your health care provider.  Apply ice to the injured area:  Put ice in a plastic bag.  Place a towel between your skin and the bag.  Leave the ice on for 15-20 minutes at a time, 3-4 times a day.  Keep your leg raised (elevated) when possible to lessen swelling.  Avoid activities that cause pain.  Follow specific exercises as directed by your health care provider.  Sleep with a pillow between your legs on your most comfortable side.  Record how often you have hip pain, the location of the pain, and what it feels like. SEEK MEDICAL CARE IF:   You are unable to put weight on your leg.  Your hip is red or swollen or very tender to touch.  Your pain or swelling continues or worsens after 1 week.  You have increasing difficulty walking.  You have a fever. SEEK IMMEDIATE MEDICAL CARE IF:   You have fallen.  You have a sudden increase in pain and swelling in your hip. MAKE SURE YOU:   Understand these instructions.  Will watch your condition.  Will get help right away if you are not doing well or get worse.  This information is not  intended to replace advice given to you by your health care provider. Make sure you discuss any questions you have with your health care provider.   Document Released: 03/04/2010 Document Revised: 10/05/2014 Document Reviewed: 05/11/2013 Elsevier Interactive Patient Education [employer].

## 2016-02-05 LAB — LIPID PANEL
Cholesterol: 183 mg/dL (ref 0–200)
HDL: 79.3 mg/dL (ref >39.00)
LDL Cholesterol: 78 mg/dL (ref 0–99)
NONHDL: 103.24
Total CHOL/HDL Ratio: 2
Triglycerides: 124 mg/dL (ref 0.0–149.0)
VLDL: 24.8 mg/dL (ref 0.0–40.0)

## 2016-02-05 LAB — COMPREHENSIVE METABOLIC PANEL
ALBUMIN: 4.5 g/dL (ref 3.5–5.2)
ALK PHOS: 46 U/L (ref 39–117)
ALT: 13 U/L (ref 0–35)
AST: 14 U/L (ref 0–37)
BILIRUBIN TOTAL: 0.3 mg/dL (ref 0.2–1.2)
BUN: 15 mg/dL (ref 6–23)
CALCIUM: 9.5 mg/dL (ref 8.4–10.5)
CO2: 27 mEq/L (ref 19–32)
Chloride: 103 mEq/L (ref 96–112)
Creatinine, Ser: 0.8 mg/dL (ref 0.40–1.20)
GFR: 87.23 mL/min (ref >60.00)
Glucose, Bld: 73 mg/dL (ref 70–99)
Potassium: 4.2 mEq/L (ref 3.5–5.1)
Sodium: 139 mEq/L (ref 135–145)
TOTAL PROTEIN: 6.8 g/dL (ref 6.0–8.3)

## 2016-02-05 LAB — CBC WITH DIFFERENTIAL/PLATELET
BASOS ABS: 0 10*3/uL (ref 0.0–0.1)
Basophils Relative: 1 % (ref 0.0–3.0)
Eosinophils Absolute: 0.1 10*3/uL (ref 0.0–0.7)
Eosinophils Relative: 1.9 % (ref 0.0–5.0)
HEMATOCRIT: 35.9 % — AB (ref 36.0–46.0)
Hemoglobin: 12.3 g/dL (ref 12.0–15.0)
LYMPHS ABS: 1.4 10*3/uL (ref 0.7–4.0)
LYMPHS PCT: 32.4 % (ref 12.0–46.0)
MCHC: 34.3 g/dL (ref 30.0–36.0)
MCV: 91.3 fl (ref 78.0–100.0)
MONOS PCT: 7.6 % (ref 3.0–12.0)
Monocytes Absolute: 0.3 10*3/uL (ref 0.1–1.0)
NEUTROS PCT: 57.1 % (ref 43.0–77.0)
Neutro Abs: 2.4 10*3/uL (ref 1.4–7.7)
Platelets: 259 10*3/uL (ref 150.0–400.0)
RBC: 3.93 Mil/uL (ref 3.87–5.11)
RDW: 12.6 % (ref 11.5–15.5)
WBC: 4.2 10*3/uL (ref 4.0–10.5)

## 2016-02-05 LAB — TSH: TSH: 1.71 u[IU]/mL (ref 0.35–4.50)

## 2016-04-27 ENCOUNTER — Other Ambulatory Visit: Payer: Self-pay | Admitting: Family Medicine

## 2016-04-27 DIAGNOSIS — Z6827 Body mass index (BMI) 27.0-27.9, adult: Secondary | ICD-10-CM | POA: Diagnosis not present

## 2016-04-27 DIAGNOSIS — F4323 Adjustment disorder with mixed anxiety and depressed mood: Secondary | ICD-10-CM

## 2016-04-27 DIAGNOSIS — Z01419 Encounter for gynecological examination (general) (routine) without abnormal findings: Secondary | ICD-10-CM | POA: Diagnosis not present

## 2016-05-27 ENCOUNTER — Ambulatory Visit (INDEPENDENT_AMBULATORY_CARE_PROVIDER_SITE_OTHER): Payer: BLUE CROSS/BLUE SHIELD | Admitting: *Deleted

## 2016-05-27 DIAGNOSIS — Z111 Encounter for screening for respiratory tuberculosis: Secondary | ICD-10-CM

## 2016-05-27 NOTE — Progress Notes (Signed)
Pre visit review using our clinic review tool, if applicable. No additional management support is needed unless otherwise documented below in the visit note.  Verbal order from [employer], PA-C to place PPD.  PPD Placement note [name], 35 y.o. female is here today for placement of PPD test Reason for PPD test: Required for school Pt taken PPD test before: yes Verified in allergy area and with patient that they are not allergic to the products PPD is made of (Phenol or Tween). Yes Has the patient been in recent contact with anyone known or suspected of having active TB disease?: no  O: Alert and oriented in NAD. P:  PPD placed in [facility] on 05/27/2016.  Patient advised to return for reading within 48-72 hours.  Dorrene German, RN

## 2016-05-29 ENCOUNTER — Encounter: Payer: Self-pay | Admitting: *Deleted

## 2016-05-29 ENCOUNTER — Ambulatory Visit (INDEPENDENT_AMBULATORY_CARE_PROVIDER_SITE_OTHER): Payer: BLUE CROSS/BLUE SHIELD | Admitting: *Deleted

## 2016-05-29 DIAGNOSIS — Z23 Encounter for immunization: Secondary | ICD-10-CM | POA: Diagnosis not present

## 2016-05-29 LAB — TB SKIN TEST
Induration: 0 mm
TB SKIN TEST: NEGATIVE

## 2016-06-02 ENCOUNTER — Ambulatory Visit: Payer: BLUE CROSS/BLUE SHIELD

## 2016-06-29 ENCOUNTER — Other Ambulatory Visit: Payer: Self-pay

## 2016-06-29 DIAGNOSIS — F4323 Adjustment disorder with mixed anxiety and depressed mood: Secondary | ICD-10-CM

## 2016-06-29 MED ORDER — ESCITALOPRAM OXALATE 10 MG PO TABS: 10.0000 mg | ORAL_TABLET | Freq: Every day | ORAL | 0 refills | Status: DC

## 2016-08-11 ENCOUNTER — Other Ambulatory Visit: Payer: Self-pay

## 2016-08-11 DIAGNOSIS — F4323 Adjustment disorder with mixed anxiety and depressed mood: Secondary | ICD-10-CM

## 2016-08-11 MED ORDER — ESCITALOPRAM OXALATE 10 MG PO TABS: 10.0000 mg | ORAL_TABLET | Freq: Every day | ORAL | 1 refills | Status: DC

## 2016-12-25 HISTORY — PX: EYE SURGERY: SHX253

## 2017-02-13 ENCOUNTER — Other Ambulatory Visit: Payer: Self-pay | Admitting: Family Medicine

## 2017-02-13 DIAGNOSIS — F4323 Adjustment disorder with mixed anxiety and depressed mood: Secondary | ICD-10-CM

## 2017-02-19 ENCOUNTER — Encounter: Payer: Self-pay | Admitting: Family Medicine

## 2017-02-19 ENCOUNTER — Ambulatory Visit (INDEPENDENT_AMBULATORY_CARE_PROVIDER_SITE_OTHER): Payer: BLUE CROSS/BLUE SHIELD | Admitting: Family Medicine

## 2017-02-19 VITALS — BP 117/76 | HR 68 | Temp 98.0°F | Resp 14 | Ht 67.0 in | Wt 192.6 lb

## 2017-02-19 DIAGNOSIS — F4323 Adjustment disorder with mixed anxiety and depressed mood: Secondary | ICD-10-CM

## 2017-02-19 DIAGNOSIS — Z0000 Encounter for general adult medical examination without abnormal findings: Secondary | ICD-10-CM

## 2017-02-19 DIAGNOSIS — F988 Other specified behavioral and emotional disorders with onset usually occurring in childhood and adolescence: Secondary | ICD-10-CM | POA: Diagnosis not present

## 2017-02-19 LAB — CBC
HCT: 38.9 % (ref 35.0–45.0)
Hemoglobin: 12.9 g/dL (ref 11.7–15.5)
MCH: 30.9 pg (ref 27.0–33.0)
MCHC: 33.2 g/dL (ref 32.0–36.0)
MCV: 93.3 fL (ref 80.0–100.0)
MPV: 9.2 fL (ref 7.5–12.5)
PLATELETS: 298 10*3/uL (ref 140–400)
RBC: 4.17 MIL/uL (ref 3.80–5.10)
RDW: 12.6 % (ref 11.0–15.0)
WBC: 4.6 10*3/uL (ref 3.8–10.8)

## 2017-02-19 LAB — TSH: TSH: 1.33 mIU/L

## 2017-02-19 MED ORDER — METHYLPHENIDATE HCL ER (OSM) 36 MG PO TBCR: 36.0000 mg | EXTENDED_RELEASE_TABLET | Freq: Every day | ORAL | 0 refills | Status: DC

## 2017-02-19 MED ORDER — ESCITALOPRAM OXALATE 10 MG PO TABS: 10.0000 mg | ORAL_TABLET | Freq: Every day | ORAL | 0 refills | Status: DC

## 2017-02-19 NOTE — Progress Notes (Signed)
Pre visit review using our clinic review tool, if applicable. No additional management support is needed unless otherwise documented below in the visit note. 

## 2017-02-19 NOTE — Progress Notes (Signed)
Patient ID: [name], female   DOB: [birth year], 37 y.o.   MRN: [id number]   Subjective:  I acted as a Education administrator for [employer], DO. Raiford Noble, [place]   Patient ID: [name], female    DOB: 09-22-1981, 36 y.o.   MRN: [id number]  Chief Complaint  Patient presents with  . Annual Exam  . ADHD Meds    Wants to discuss getting back on the meds. States that it has been 10+ years since last taken.    HPI  Patient is in today for a annual examination. Patient also wants to discuss getting back on ADHD medications. States that it has been over 10 years since taking any medications for this condition. Patient was also accepted into the Nursing program at [facility] and needs forms filled out by the Provider. Patient has a Hx of insomnia NOS symptom. Patient has no additional concerns noted at this time.  Patient Care Team: Carollee Herter, Alferd Apa, DO as PCP - General Marylynn Pearson, MD as Consulting Physician (Obstetrics and Gynecology)   Past Medical History:  Diagnosis Date  . Anxiety   . Anxiety and depression   . Pre-eclampsia    2010    Past Surgical History:  Procedure Laterality Date  . CESAREAN SECTION     2010  . EYE SURGERY  12/25/2016   Lasix Eye Surgery    Family History  Problem Relation Age of Onset  . Hypertension Father   . Diabetes Father        Type 2  . Heart disease Paternal Grandmother   . Pancreatic cancer Paternal Grandfather     Social History   Social History  . Marital status: Married    Spouse name: N/A  . Number of children: N/A  . Years of education: N/A   Occupational History  . [employer]   Social History Main Topics  . Smoking status: Never Smoker  . Smokeless tobacco: Never Used  . Alcohol use No  . Drug use: No  . Sexual activity: Yes    Partners: Male   Other Topics Concern  . Not on file   Social History Narrative  . No narrative on file     Outpatient Medications Prior to Visit  Medication Sig Dispense Refill  . cetirizine (ZYRTEC) 10 MG tablet Take 10 mg by mouth daily.    Marland Kitchen escitalopram (LEXAPRO) 10 MG tablet TAKE 1 TABLET (10 MG TOTAL) BY MOUTH DAILY. 90 tablet 0   No facility-administered medications prior to visit.     Allergies  Allergen Reactions  . Sulfonamide Derivatives Swelling and Rash    Review of Systems  Constitutional: Negative for fever and malaise/fatigue.  HENT: Negative for congestion.   Eyes: Negative for blurred vision.  Respiratory: Negative for cough and shortness of breath.   Cardiovascular: Negative for chest pain, palpitations and leg swelling.  Gastrointestinal: Negative for vomiting.  Musculoskeletal: Negative for back pain.  Skin: Negative for rash.  Neurological: Negative for loss of consciousness and headaches.       Objective:    Physical Exam  Constitutional: She is oriented to person, place, and time. She appears well-developed and well-nourished. No distress.  HENT:  Head: Normocephalic and atraumatic.  Eyes: Conjunctivae are normal.  Neck: Normal range of motion. No thyromegaly present.  Cardiovascular: Normal rate and regular rhythm.   Pulmonary/Chest: Effort normal and breath sounds normal. She has no wheezes.  Abdominal: Soft. Bowel sounds are normal. There is no tenderness.  Musculoskeletal: She exhibits no edema or deformity.  Neurological: She is alert and oriented to person, place, and time.  Skin: Skin is warm and dry. She is not diaphoretic.  Psychiatric: She has a normal mood and affect.    BP 117/76 (BP Location: Left Arm, Patient Position: Sitting, Cuff Size: Normal)   Pulse 68   Temp 98 F (36.7 C) (Oral)   Resp 14   Ht 5\' 7"  (1.702 m)   Wt 192 lb 9.6 oz (87.4 kg)   LMP 02/05/2017 (Approximate)   BMI 30.17 kg/m  Wt Readings from Last 3 Encounters:  02/19/17 192 lb 9.6 oz (87.4 kg)  02/04/16 164 lb 3.2 oz (74.5 kg)  07/06/14 156 lb 3.2 oz  (70.9 kg)   BP Readings from Last 3 Encounters:  02/19/17 117/76  02/04/16 108/62  07/06/14 119/76     Immunization History  Administered Date(s) Administered  . Influenza Whole 06/21/2008  . Influenza,inj,Quad PF,36+ Mos 05/29/2016  . PPD Test 05/27/2016  . Tdap 05/11/2008, 05/11/2012    Health Maintenance  Topic Date Due  . HIV Screening  01/07/1997  . INFLUENZA VACCINE  04/28/2017  . PAP SMEAR  04/22/2018  . TETANUS/TDAP  05/11/2022    Lab Results  Component Value Date   WBC 4.6 02/19/2017   HGB 12.9 02/19/2017   HCT 38.9 02/19/2017   PLT 298 02/19/2017   GLUCOSE 82 02/19/2017   CHOL 158 02/19/2017   TRIG 104 02/19/2017   HDL 74 02/19/2017   LDLCALC 63 02/19/2017   ALT 13 02/19/2017   AST 16 02/19/2017   NA 138 02/19/2017   K 4.2 02/19/2017   CL 103 02/19/2017   CREATININE 0.75 02/19/2017   BUN 13 02/19/2017   CO2 21 02/19/2017   TSH 1.33 02/19/2017    Lab Results  Component Value Date   TSH 1.33 02/19/2017   Lab Results  Component Value Date   WBC 4.6 02/19/2017   HGB 12.9 02/19/2017   HCT 38.9 02/19/2017   MCV 93.3 02/19/2017   PLT 298 02/19/2017   Lab Results  Component Value Date   NA 138 02/19/2017   K 4.2 02/19/2017   CO2 21 02/19/2017   GLUCOSE 82 02/19/2017   BUN 13 02/19/2017   CREATININE 0.75 02/19/2017   BILITOT 0.4 02/19/2017   ALKPHOS 47 02/19/2017   AST 16 02/19/2017   ALT 13 02/19/2017   PROT 6.8 02/19/2017   ALBUMIN 4.1 02/19/2017   CALCIUM 9.2 02/19/2017   GFR 87.23 02/04/2016   Lab Results  Component Value Date   CHOL 158 02/19/2017   Lab Results  Component Value Date   HDL 74 02/19/2017   Lab Results  Component Value Date   LDLCALC 63 02/19/2017   Lab Results  Component Value Date   TRIG 104 02/19/2017   Lab Results  Component Value Date   CHOLHDL 2.1 02/19/2017   No results found for: HGBA1C       Assessment & Plan:   Problem List Items Addressed This Visit      Unprioritized   Preventative  health care - Primary    ghm utd Check labs See AVS      Relevant Medications   escitalopram (LEXAPRO) 10 MG tablet   Other Relevant Orders   CBC (Completed)   Comprehensive metabolic panel (Completed)   Lipid panel (Completed)   TSH (Completed)   Measles/Mumps/Rubella Immunity (Completed)  Hepatitis B surface antibody (Completed)   Varicella zoster antibody, IgG (Completed)   Quantiferon tb gold assay    Other Visit Diagnoses    Adjustment disorder with mixed anxiety and depressed mood       Relevant Medications   escitalopram (LEXAPRO) 10 MG tablet   Attention deficit disorder, unspecified hyperactivity presence       Relevant Medications   methylphenidate (CONCERTA) 36 MG PO CR tablet      I am having Ms. Rudnicki start on methylphenidate. I am also having her maintain her cetirizine and escitalopram.  Meds ordered this encounter  Medications  . escitalopram (LEXAPRO) 10 MG tablet    Sig: Take 1 tablet (10 mg total) by mouth daily.    Dispense:  90 tablet    Refill:  0  . methylphenidate (CONCERTA) 36 MG PO CR tablet    Sig: Take 1 tablet (36 mg total) by mouth daily.    Dispense:  30 tablet    Refill:  0    CMA served as scribe during this visit. History, Physical and Plan performed by medical provider. Documentation and orders reviewed and attested to.  Ann Held, DO

## 2017-02-19 NOTE — Patient Instructions (Signed)
Preventive Care 18-39 Years, Female Preventive care refers to lifestyle choices and visits with your health care provider that can promote health and wellness. What does preventive care include?  A yearly physical exam. This is also called an annual well check.  Dental exams once or twice a year.  Routine eye exams. Ask your health care provider how often you should have your eyes checked.  Personal lifestyle choices, including:  Daily care of your teeth and gums.  Regular physical activity.  Eating a healthy diet.  Avoiding tobacco and drug use.  Limiting alcohol use.  Practicing safe sex.  Taking vitamin and mineral supplements as recommended by your health care provider. What happens during an annual well check? The services and screenings done by your health care provider during your annual well check will depend on your age, overall health, lifestyle risk factors, and family history of disease. Counseling  Your health care provider may ask you questions about your:  Alcohol use.  Tobacco use.  Drug use.  Emotional well-being.  Home and relationship well-being.  Sexual activity.  Eating habits.  Work and work environment.  Method of birth control.  Menstrual cycle.  Pregnancy history. Screening  You may have the following tests or measurements:  Height, weight, and BMI.  Diabetes screening. This is done by checking your blood sugar (glucose) after you have not eaten for a while (fasting).  Blood pressure.  Lipid and cholesterol levels. These may be checked every 5 years starting at age 20.  Skin check.  Hepatitis C blood test.  Hepatitis B blood test.  Sexually transmitted disease (STD) testing.  BRCA-related cancer screening. This may be done if you have a family history of breast, ovarian, tubal, or peritoneal cancers.  Pelvic exam and Pap test. This may be done every 3 years starting at age 21. Starting at age 30, this may be done every 5  years if you have a Pap test in combination with an HPV test. Discuss your test results, treatment options, and if necessary, the need for more tests with your health care provider. Vaccines  Your health care provider may recommend certain vaccines, such as:  Influenza vaccine. This is recommended every year.  Tetanus, diphtheria, and acellular pertussis (Tdap, Td) vaccine. You may need a Td booster every 10 years.  Varicella vaccine. You may need this if you have not been vaccinated.  HPV vaccine. If you are 26 or younger, you may need three doses over 6 months.  Measles, mumps, and rubella (MMR) vaccine. You may need at least one dose of MMR. You may also need a second dose.  Pneumococcal 13-valent conjugate (PCV13) vaccine. You may need this if you have certain conditions and were not previously vaccinated.  Pneumococcal polysaccharide (PPSV23) vaccine. You may need one or two doses if you smoke cigarettes or if you have certain conditions.  Meningococcal vaccine. One dose is recommended if you are age 19-21 years and a first-year college student living in a residence hall, or if you have one of several medical conditions. You may also need additional booster doses.  Hepatitis A vaccine. You may need this if you have certain conditions or if you travel or work in places where you may be exposed to hepatitis A.  Hepatitis B vaccine. You may need this if you have certain conditions or if you travel or work in places where you may be exposed to hepatitis B.  Haemophilus influenzae type b (Hib) vaccine. You may need this   if you have certain risk factors. Talk to your health care provider about which screenings and vaccines you need and how often you need them. This information is not intended to replace advice given to you by your health care provider. Make sure you discuss any questions you have with your health care provider. Document Released: 11/10/2001 Document Revised: 06/03/2016  Document Reviewed: 07/16/2015 Elsevier Interactive Patient Education  2017 [employer].

## 2017-02-20 DIAGNOSIS — Z0000 Encounter for general adult medical examination without abnormal findings: Secondary | ICD-10-CM | POA: Insufficient documentation

## 2017-02-20 LAB — COMPREHENSIVE METABOLIC PANEL
ALT: 13 U/L (ref 6–29)
AST: 16 U/L (ref 10–30)
Albumin: 4.1 g/dL (ref 3.6–5.1)
Alkaline Phosphatase: 47 U/L (ref 33–115)
BUN: 13 mg/dL (ref 7–25)
CHLORIDE: 103 mmol/L (ref 98–110)
CO2: 21 mmol/L (ref 20–31)
CREATININE: 0.75 mg/dL (ref 0.50–1.10)
Calcium: 9.2 mg/dL (ref 8.6–10.2)
Glucose, Bld: 82 mg/dL (ref 65–99)
Potassium: 4.2 mmol/L (ref 3.5–5.3)
SODIUM: 138 mmol/L (ref 135–146)
TOTAL PROTEIN: 6.8 g/dL (ref 6.1–8.1)
Total Bilirubin: 0.4 mg/dL (ref 0.2–1.2)

## 2017-02-20 LAB — HEPATITIS B SURFACE ANTIBODY, QUANTITATIVE: Hepatitis B-Post: 276 m[IU]/mL

## 2017-02-20 LAB — LIPID PANEL
Cholesterol: 158 mg/dL (ref <200)
HDL: 74 mg/dL (ref >50)
LDL CALC: 63 mg/dL (ref <100)
Total CHOL/HDL Ratio: 2.1 Ratio (ref <5.0)
Triglycerides: 104 mg/dL (ref <150)
VLDL: 21 mg/dL (ref <30)

## 2017-02-20 NOTE — Assessment & Plan Note (Signed)
ghm utd Check labs See AVS 

## 2017-02-23 LAB — QUANTIFERON TB GOLD ASSAY (BLOOD)
Interferon Gamma Release Assay: NEGATIVE
Mitogen-Nil: >10 IU/mL
Quantiferon Nil Value: 0.03 IU/mL
Quantiferon Tb Ag Minus Nil Value: 0.03 IU/mL

## 2017-02-23 LAB — MEASLES/MUMPS/RUBELLA IMMUNITY
RUBELLA: 2.89 {index} — AB (ref <0.90)
Rubeola IgG: >300 AU/mL — ABNORMAL HIGH (ref <25.00)

## 2017-02-23 LAB — VARICELLA ZOSTER ANTIBODY, IGG: Varicella IgG: 1264 Index — ABNORMAL HIGH (ref <135.00)

## 2017-03-19 ENCOUNTER — Encounter: Payer: Self-pay | Admitting: Family Medicine

## 2017-03-19 ENCOUNTER — Ambulatory Visit (INDEPENDENT_AMBULATORY_CARE_PROVIDER_SITE_OTHER): Payer: BLUE CROSS/BLUE SHIELD | Admitting: Family Medicine

## 2017-03-19 DIAGNOSIS — F988 Other specified behavioral and emotional disorders with onset usually occurring in childhood and adolescence: Secondary | ICD-10-CM | POA: Diagnosis not present

## 2017-03-19 DIAGNOSIS — F4323 Adjustment disorder with mixed anxiety and depressed mood: Secondary | ICD-10-CM

## 2017-03-19 MED ORDER — ESCITALOPRAM OXALATE 10 MG PO TABS: 10.0000 mg | ORAL_TABLET | Freq: Every day | ORAL | 3 refills | Status: DC

## 2017-03-19 MED ORDER — METHYLPHENIDATE HCL ER (OSM) 36 MG PO TBCR: 36.0000 mg | EXTENDED_RELEASE_TABLET | Freq: Every day | ORAL | 0 refills | Status: DC

## 2017-03-19 NOTE — Patient Instructions (Signed)

## 2017-03-19 NOTE — Assessment & Plan Note (Signed)
Stable con't lexapro rto 6 months or sooner prn

## 2017-03-19 NOTE — Assessment & Plan Note (Signed)
Doing well with meds rto 6 months or sooner prn

## 2017-03-19 NOTE — Progress Notes (Signed)
Patient ID: [name], female    DOB: Oct 24, 1980  Age: 36 y.o. MRN: [id number]    Subjective:  Subjective  HPI [name] presents for f/u adhd and depression.  She is happy with meds -- they are working well.  No complaints.    Review of Systems  Constitutional: Negative for appetite change, diaphoresis, fatigue and unexpected weight change.  Eyes: Negative for pain, redness and visual disturbance.  Respiratory: Negative for cough, chest tightness, shortness of breath and wheezing.   Cardiovascular: Negative for chest pain, palpitations and leg swelling.  Endocrine: Negative for cold intolerance, heat intolerance, polydipsia, polyphagia and polyuria.  Genitourinary: Negative for difficulty urinating, dysuria and frequency.  Neurological: Negative for dizziness, light-headedness, numbness and headaches.    History Past Medical History:  Diagnosis Date  . Anxiety   . Anxiety and depression   . Pre-eclampsia    2010    She has a past surgical history that includes Cesarean section and Eye surgery (12/25/2016).   Her family history includes Diabetes in her father; Heart disease in her paternal grandmother; Hypertension in her father; Pancreatic cancer in her paternal grandfather.She reports that she has never smoked. She has never used smokeless tobacco. She reports that she does not drink alcohol or use drugs.  Current Outpatient Prescriptions on File Prior to Visit  Medication Sig Dispense Refill  . cetirizine (ZYRTEC) 10 MG tablet Take 10 mg by mouth daily.     No current facility-administered medications on file prior to visit.      Objective:  Objective  Physical Exam  Constitutional: She is oriented to person, place, and time. She appears well-developed and well-nourished.  HENT:  Head: Normocephalic and atraumatic.  Eyes: Conjunctivae and EOM are normal.  Neck: Normal range of motion. Neck supple. No JVD present. Carotid bruit is not present. No  thyromegaly present.  Cardiovascular: Normal rate, regular rhythm and normal heart sounds.   No murmur heard. Pulmonary/Chest: Effort normal and breath sounds normal. No respiratory distress. She has no wheezes. She has no rales. She exhibits no tenderness.  Musculoskeletal: She exhibits no edema.  Neurological: She is alert and oriented to person, place, and time.  Psychiatric: She has a normal mood and affect.  Nursing note and vitals reviewed.  BP 118/78 (BP Location: Left Arm, Patient Position: Sitting, Cuff Size: Normal)   Pulse 81   Temp 98.2 F (36.8 C) (Oral)   Resp 14   Ht 5\' 7"  (1.702 m)   Wt 191 lb (86.6 kg)   SpO2 98%   BMI 29.91 kg/m  Wt Readings from Last 3 Encounters:  03/19/17 191 lb (86.6 kg)  02/19/17 192 lb 9.6 oz (87.4 kg)  02/04/16 164 lb 3.2 oz (74.5 kg)     Lab Results  Component Value Date   WBC 4.6 02/19/2017   HGB 12.9 02/19/2017   HCT 38.9 02/19/2017   PLT 298 02/19/2017   GLUCOSE 82 02/19/2017   CHOL 158 02/19/2017   TRIG 104 02/19/2017   HDL 74 02/19/2017   LDLCALC 63 02/19/2017   ALT 13 02/19/2017   AST 16 02/19/2017   NA 138 02/19/2017   K 4.2 02/19/2017   CL 103 02/19/2017   CREATININE 0.75 02/19/2017   BUN 13 02/19/2017   CO2 21 02/19/2017   TSH 1.33 02/19/2017    US Breast Left  Result Date: 02/17/2010 Clinical Data:  Palpable left breast lump  LEFT BREAST ULTRASOUND  Comparison: None.  On physical exam,  I palpate a discrete mass in the left breast at 7 o'clock 12 cm from the nipple.  Findings: Ultrasound is performed, showing there is a superficial, hypoechoic lesion in the left breast at 7 o'clock 12 cm from the nipple measuring 1.2 x 0.7 x 1.2 cm.  There is a tract leading to the skin surface.  Is felt to likely be a sebaceous cyst.  IMPRESSION: Probable sebaceous cyst in the left breast.  Short-term interval follow-up ultrasound in 6 months is recommended.  BI-RADS CATEGORY 3:  Probably benign finding(s) - short interval  follow-up suggested. Provider: Lizabeth Leyden    Assessment & Plan:  Plan  I am having Ms. Bourn maintain her cetirizine, escitalopram, and methylphenidate.  Meds ordered this encounter  Medications  . escitalopram (LEXAPRO) 10 MG tablet    Sig: Take 1 tablet (10 mg total) by mouth daily.    Dispense:  90 tablet    Refill:  3  . methylphenidate (CONCERTA) 36 MG PO CR tablet    Sig: Take 1 tablet (36 mg total) by mouth daily.    Dispense:  90 tablet    Refill:  0    Problem List Items Addressed This Visit      Unprioritized   Adjustment disorder with mixed anxiety and depressed mood    Stable con't lexapro rto 6 months or sooner prn      Relevant Medications   escitalopram (LEXAPRO) 10 MG tablet   Attention deficit disorder    Doing well with meds rto 6 months or sooner prn      Relevant Medications   methylphenidate (CONCERTA) 36 MG PO CR tablet      Follow-up: Return in about 6 months (around 09/18/2017).  Ann Held, DO    Pre visit review using our clinic review tool, if applicable. No additional management support is needed unless otherwise documented below in the visit note.

## 2017-05-03 DIAGNOSIS — Z01419 Encounter for gynecological examination (general) (routine) without abnormal findings: Secondary | ICD-10-CM | POA: Diagnosis not present

## 2017-05-03 DIAGNOSIS — Z6831 Body mass index (BMI) 31.0-31.9, adult: Secondary | ICD-10-CM | POA: Diagnosis not present

## 2017-05-19 ENCOUNTER — Encounter: Payer: Self-pay | Admitting: Family Medicine

## 2017-05-19 ENCOUNTER — Telehealth: Payer: Self-pay | Admitting: Family Medicine

## 2017-05-19 DIAGNOSIS — F988 Other specified behavioral and emotional disorders with onset usually occurring in childhood and adolescence: Secondary | ICD-10-CM

## 2017-05-19 MED ORDER — METHYLPHENIDATE HCL ER (OSM) 36 MG PO TBCR: 36.0000 mg | EXTENDED_RELEASE_TABLET | Freq: Every day | ORAL | 0 refills | Status: DC

## 2017-05-19 NOTE — Telephone Encounter (Signed)
error:[id number] ° °

## 2017-05-19 NOTE — Telephone Encounter (Signed)
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 Pt says that she currently take 10 MG Lexapro she would like to know if she can increase her dosage to 20 MG ? OR would provider want to start her on something different?   Please advised. LB 

## 2017-05-19 NOTE — Telephone Encounter (Signed)
Called pt back- she may certainly try increasing her lexapro to 20 mg.  She will double up on her 10 mg dose to see how this works for her. She plans to see Dr. Etter Williams for a recheck soon to check on how this is going She also reports that Dr. Etter Williams gave her a 90 day rx for concerta back in June- however, the pharmacy would only give her 30 pills and was not able to dispense the remaining 60 as refills.  She requests refills of her concerta to pick up  [name]: indeed, she got #81 concerta on 1/57  Will give 2 refills of concerta for her to pick up  Meds ordered this encounter  Medications  . DISCONTD: methylphenidate (CONCERTA) 36 MG PO CR tablet    Sig: Take 1 tablet (36 mg total) by mouth daily.    Dispense:  30 tablet    Refill:  0  . methylphenidate (CONCERTA) 36 MG PO CR tablet    Sig: Take 1 tablet (36 mg total) by mouth daily. To fill in 30 days    Dispense:  30 tablet    Refill:  0

## 2017-05-19 NOTE — Telephone Encounter (Signed)
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 Pt says that she currently take 10 MG Lexapro she would like to know if she can increase her dosage to 20 MG ? OR would provider want to start her on something different?   Please advised.   CB: [phone]

## 2017-05-19 NOTE — Telephone Encounter (Signed)
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 Pt says that she currently take 10 MG Lexapro she would like to know if she can increase her dosage to 20 MG ? OR would provider want to start her on something different?   Please advised. LB

## 2017-05-19 NOTE — Addendum Note (Signed)
Addended by: Lamar Blinks C on: 05/19/2017 04:54 PM   Modules accepted: Orders

## 2017-06-08 ENCOUNTER — Encounter: Payer: Self-pay | Admitting: Family Medicine

## 2017-06-09 MED ORDER — ESCITALOPRAM OXALATE 20 MG PO TABS: 20.0000 mg | ORAL_TABLET | Freq: Every day | ORAL | 1 refills | Status: DC

## 2017-06-09 NOTE — Telephone Encounter (Signed)
Lexapro increased to 20mg  by Dr. Lorelei Pont 05/19/2018- Refills sent.

## 2017-06-16 ENCOUNTER — Ambulatory Visit (INDEPENDENT_AMBULATORY_CARE_PROVIDER_SITE_OTHER): Payer: BLUE CROSS/BLUE SHIELD

## 2017-06-16 DIAGNOSIS — Z23 Encounter for immunization: Secondary | ICD-10-CM | POA: Diagnosis not present

## 2017-06-16 NOTE — Progress Notes (Signed)
Patient in for influenza injection per Dr. Carollee Herter.  No complaints voiced this visit.  Given 0.5 ml IM left deltoid.

## 2017-07-19 ENCOUNTER — Telehealth: Payer: Self-pay | Admitting: Family Medicine

## 2017-07-19 DIAGNOSIS — F988 Other specified behavioral and emotional disorders with onset usually occurring in childhood and adolescence: Secondary | ICD-10-CM

## 2017-07-20 MED ORDER — METHYLPHENIDATE HCL ER (OSM) 36 MG PO TBCR: 36.0000 mg | EXTENDED_RELEASE_TABLET | Freq: Every day | ORAL | 0 refills | Status: DC

## 2017-07-20 NOTE — Telephone Encounter (Signed)
Pt is requesting refill on Concerta 36mg .   Last OV: 03/19/2017 Last Fill: 05/19/2017 #30 and 0RF UDS: None Contract: None  Please advise.

## 2017-07-20 NOTE — Telephone Encounter (Signed)
Martha printed, placed on ledge.

## 2017-07-20 NOTE — Telephone Encounter (Signed)
Need contract and uds ----ok to refill x3

## 2017-07-20 NOTE — Telephone Encounter (Signed)
Refill x 3 months 

## 2017-07-20 NOTE — Telephone Encounter (Signed)
Pt informed via MyChart that Rx's have been placed at front desk for pick up at her convenience.

## 2017-07-20 NOTE — Telephone Encounter (Signed)
Rx's for October, November, and December 2018 printed, awaiting DO signature.

## 2017-07-26 ENCOUNTER — Other Ambulatory Visit: Payer: BLUE CROSS/BLUE SHIELD

## 2017-07-26 DIAGNOSIS — Z79899 Other long term (current) drug therapy: Secondary | ICD-10-CM | POA: Diagnosis not present

## 2017-07-30 LAB — PAIN MGMT, PROFILE 8 W/CONF, U
6 Acetylmorphine: NEGATIVE ng/mL (ref <10)
ALCOHOL METABOLITES: POSITIVE ng/mL — AB (ref <500)
ALPHAHYDROXYMIDAZOLAM: NEGATIVE ng/mL (ref <50)
Alphahydroxyalprazolam: NEGATIVE ng/mL (ref <25)
Alphahydroxytriazolam: NEGATIVE ng/mL (ref <50)
Aminoclonazepam: NEGATIVE ng/mL (ref <25)
Amphetamines: NEGATIVE ng/mL (ref <500)
BENZODIAZEPINES: NEGATIVE ng/mL (ref <100)
Buprenorphine, Urine: NEGATIVE ng/mL (ref <5)
Cocaine Metabolite: NEGATIVE ng/mL (ref <150)
Creatinine: 70.8 mg/dL
ETHYL GLUCURONIDE (ETG): 1741 ng/mL — AB (ref <500)
ETHYL SULFATE (ETS): 143 ng/mL — AB (ref <100)
HYDROXYETHYLFLURAZEPAM: NEGATIVE ng/mL (ref <50)
Lorazepam: NEGATIVE ng/mL (ref <50)
MARIJUANA METABOLITE: NEGATIVE ng/mL (ref <20)
MDMA: NEGATIVE ng/mL (ref <500)
NORDIAZEPAM: NEGATIVE ng/mL (ref <50)
OPIATES: NEGATIVE ng/mL (ref <100)
Oxazepam: NEGATIVE ng/mL (ref <50)
Oxidant: NEGATIVE ug/mL (ref <200)
Oxycodone: NEGATIVE ng/mL (ref <100)
Temazepam: NEGATIVE ng/mL (ref <50)
pH: 7.26 (ref 4.5–9.0)

## 2017-08-11 ENCOUNTER — Other Ambulatory Visit: Payer: Self-pay | Admitting: *Deleted

## 2017-08-11 MED ORDER — ESCITALOPRAM OXALATE 20 MG PO TABS: 20.0000 mg | ORAL_TABLET | Freq: Every day | ORAL | 0 refills | Status: DC

## 2017-09-11 ENCOUNTER — Emergency Department (HOSPITAL_COMMUNITY)
Admission: EM | Admit: 2017-09-11 | Discharge: 2017-09-11 | Disposition: A | Discharge status: Home / Self Care | Payer: BLUE CROSS/BLUE SHIELD | Attending: Emergency Medicine | Admitting: Emergency Medicine

## 2017-09-11 ENCOUNTER — Encounter (HOSPITAL_COMMUNITY): Payer: Self-pay | Admitting: Emergency Medicine

## 2017-09-11 ENCOUNTER — Other Ambulatory Visit: Payer: Self-pay

## 2017-09-11 DIAGNOSIS — Z79899 Other long term (current) drug therapy: Secondary | ICD-10-CM | POA: Diagnosis not present

## 2017-09-11 DIAGNOSIS — R51 Headache: Secondary | ICD-10-CM | POA: Diagnosis not present

## 2017-09-11 DIAGNOSIS — R519 Headache, unspecified: Secondary | ICD-10-CM

## 2017-09-11 MED ORDER — PROCHLORPERAZINE EDISYLATE 5 MG/ML IJ SOLN
10.0000 mg | Freq: Once | INTRAMUSCULAR | Status: AC
  Administered 2017-09-11: 10 mg via INTRAVENOUS
  Filled 2017-09-11: qty 2

## 2017-09-11 MED ORDER — DIPHENHYDRAMINE HCL 50 MG/ML IJ SOLN
12.5000 mg | Freq: Once | INTRAMUSCULAR | Status: AC
  Administered 2017-09-11: 12.5 mg via INTRAVENOUS
  Filled 2017-09-11: qty 1

## 2017-09-11 MED ORDER — KETOROLAC TROMETHAMINE 30 MG/ML IJ SOLN
30.0000 mg | Freq: Once | INTRAMUSCULAR | Status: AC
Start: 2017-09-11 — End: 2017-09-11
  Administered 2017-09-11: 30 mg via INTRAVENOUS
  Filled 2017-09-11: qty 1

## 2017-09-11 MED ORDER — SODIUM CHLORIDE 0.9 % IV BOLUS (SEPSIS)
500.0000 mL | Freq: Once | INTRAVENOUS | Status: AC
  Administered 2017-09-11: 500 mL via INTRAVENOUS

## 2017-09-11 MED ORDER — OXYCODONE-ACETAMINOPHEN 5-325 MG PO TABS
1.0000 | ORAL_TABLET | Freq: Once | ORAL | Status: AC
  Administered 2017-09-11: 1 via ORAL
  Filled 2017-09-11: qty 1

## 2017-09-11 NOTE — ED Notes (Signed)
Pt had episode of vomiting in waiting room, laid herself on the floor. Drinking soda at this time.

## 2017-09-11 NOTE — ED Triage Notes (Signed)
Pt reports headache since tuesday, states she was concerned for sinuses and took some pseudophedrine and Excedrin with no relief. Denies hx of headaches. Reports sensitivity to light, acid reflux and nausea.

## 2017-09-11 NOTE — ED Provider Notes (Signed)
[facility] EMERGENCY DEPARTMENT Provider Note   CSN: [id number] Arrival date & time: 09/11/17  0227     History   Chief Complaint Chief Complaint  Patient presents with  . Headache    HPI [name] is a 36 y.o. female.  The history is provided by the patient and medical records. No language interpreter was used.  Headache     [name] is a 36 y.o. female with no pertinent PMH who presents to the Emergency Department complaining of constant, persistent right sided throbbing, pressure-like headache x 5 days.  Patient has tried Fioricet and Excedrin Migraine with no improvement.  She thought the headache was secondary to sinus infection, therefore also tried Sudafed with no relief.  Associated with photophobia.  Patient states that she was given Percocet daily and did have one episode of vomiting after.  She has not been feeling nauseous or throwing up at home.  She believes that the Percocet may have upset her stomach.  No longer feels nauseous.  No fever, chills, neck pain, visual changes, abdominal pain, numbness, tingling, weakness, dizziness, syncope.   Past Medical History:  Diagnosis Date  . Anxiety   . Anxiety and depression   . Pre-eclampsia    2010    Patient Active Problem List   Diagnosis Date Noted  . Attention deficit disorder 03/19/2017  . Adjustment disorder with mixed anxiety and depressed mood 03/19/2017  . Preventative health care 02/20/2017  . Acute cystitis with hematuria 07/06/2014  . Anxiety 05/11/2012  . SYMPTOM, INSOMNIA NOS 06/08/2007    Past Surgical History:  Procedure Laterality Date  . CESAREAN SECTION     2010  . EYE SURGERY  12/25/2016   [facility]    OB History    No data available       Home Medications    Prior to Admission medications   Medication Sig Start Date End Date Taking? Authorizing Provider  cetirizine (ZYRTEC) 10 MG tablet Take 10 mg by mouth daily.    [provider]  escitalopram (LEXAPRO) 20 MG tablet Take 1 tablet (20 mg total) daily by mouth. 08/11/17   Carollee Herter, Alferd Apa, DO  methylphenidate (CONCERTA) 36 MG PO CR tablet Take 1 tablet (36 mg total) by mouth daily. October 2018 07/20/17   Ann Held, DO  methylphenidate (CONCERTA) 36 MG PO CR tablet Take 1 tablet (36 mg total) by mouth daily. 07/20/17   Ann Held, DO  methylphenidate (CONCERTA) 36 MG PO CR tablet Take 1 tablet (36 mg total) by mouth daily. 07/20/17   Ann Held, DO    Family History Family History  Problem Relation Age of Onset  . Hypertension Father   . Diabetes Father        Type 2  . Heart disease Paternal Grandmother   . Pancreatic cancer Paternal Grandfather     Social History Social History   Tobacco Use  . Smoking status: Never Smoker  . Smokeless tobacco: Never Used  Substance Use Topics  . Alcohol use: No  . Drug use: No     Allergies   Sulfonamide derivatives   Review of Systems Review of Systems  Neurological: Positive for headaches. Negative for dizziness, syncope, speech difficulty, weakness and numbness.  All other systems reviewed and are negative.    Physical Exam Updated Vital Signs BP (!) 140/92   Pulse 86   Temp 98.4 F (36.9 C) (Oral)  Resp 15   Ht 5\' 6"  (1.676 m)   Wt 79.4 kg (175 lb)   LMP 08/01/2017 (Within Weeks)   SpO2 99%   BMI 28.25 kg/m   Physical Exam  Constitutional: She is oriented to person, place, and time. She appears well-developed and well-nourished. No distress.  HENT:  Head: Normocephalic and atraumatic.  Mouth/Throat: Oropharynx is clear and moist.  No tenderness of the temporal artery   Eyes: Conjunctivae and EOM are normal. Pupils are equal, round, and reactive to light. No scleral icterus.  No nystagmus   Neck: Normal range of motion. Neck supple.  Full active and passive ROM without pain.  No midline or paraspinal tenderness. No nuchal rigidity or  meningeal signs.  Cardiovascular: Normal rate, regular rhythm, normal heart sounds and intact distal pulses.  Pulmonary/Chest: Effort normal and breath sounds normal. No respiratory distress. She has no wheezes. She has no rales.  Abdominal: Soft. Bowel sounds are normal. She exhibits no distension. There is no tenderness. There is no rebound and no guarding.  Musculoskeletal: Normal range of motion.  Lymphadenopathy:    She has no cervical adenopathy.  Neurological: She is alert and oriented to person, place, and time. She has normal reflexes. No cranial nerve deficit. Coordination normal.  Alert, oriented, thought content appropriate, able to give a coherent history. Speech is clear and goal oriented, able to follow commands.  Cranial Nerves:  II:  Peripheral visual fields grossly normal, pupils equal, round, reactive to light III, IV, VI: EOM intact bilaterally, ptosis not present V,VII: smile symmetric, eyes kept closed tightly against resistance, facial light touch sensation equal VIII: hearing grossly normal IX, X: symmetric soft palate movement, uvula elevates symmetrically  XI: bilateral shoulder shrug symmetric and strong XII: midline tongue extension 5/5 muscle strength in upper and lower extremities bilaterally including strong and equal grip strength and dorsiflexion/plantar flexion Sensory to light touch normal in all four extremities.  Normal finger-to-nose and rapid alternating movements. No drift. Steady gait.  Skin: Skin is warm and dry. No rash noted. She is not diaphoretic.  Nursing note and vitals reviewed.    ED Treatments / Results  Labs (all labs ordered are listed, but only abnormal results are displayed) Labs Reviewed - No data to display  EKG  EKG Interpretation None       Radiology No results found.  Procedures Procedures (including critical care time)  Medications Ordered in ED Medications  oxyCODONE-acetaminophen (PERCOCET/ROXICET) 5-325 MG  per tablet 1 tablet (1 tablet Oral Given 09/11/17 0242)  sodium chloride 0.9 % bolus 500 mL (0 mLs Intravenous Stopped 09/11/17 0701)  ketorolac (TORADOL) 30 MG/ML injection 30 mg (30 mg Intravenous Given 09/11/17 6440)  prochlorperazine (COMPAZINE) injection 10 mg (10 mg Intravenous Given 09/11/17 0637)  diphenhydrAMINE (BENADRYL) injection 12.5 mg (12.5 mg Intravenous Given 09/11/17 3474)     Initial Impression / Assessment and Plan / ED Course  I have reviewed the triage vital signs and the nursing notes.  Pertinent labs & imaging results that were available during my care of the patient were reviewed by me and considered in my medical decision making (see chart for details).    Ralph Benavidez is a 36 y.o. female who presents to ED for headache. No focal neuro deficits on exam. Migraine cocktail and fluids given.   On re-evaluation, patient feels much improved. The patient denies any neurologic symptoms such as visual changes, focal numbness/weakness, balance problems, confusion, or speech difficulty to suggest a life-threatening intracranial  process such as intracranial hemorrhage or mass. The patient has no clotting risk factors thus venous sinus thrombosis is unlikely. No fevers, neck pain or nuchal rigidity to suggest meningitis. I feel that the patient is safe for discharge home at this time. PCP follow up strongly encouraged. I have reviewed return precautions including development of neurologic symptoms, confusion, lethargy, difficulty speaking, or new/worsening/concerning symptoms. All questions answered.   Final Clinical Impressions(s) / ED Diagnoses   Final diagnoses:  Bad headache    ED Discharge Orders    None       Ward, Ozella Almond, PA-C 09/11/17 7494    Ezequiel Essex, MD 09/11/17 [phone]

## 2017-09-11 NOTE — Discharge Instructions (Signed)
It was my pleasure taking care of you today! ° °Drink plenty of fluids at home. This will help with your headache.  °Please follow up with your primary care doctor.   ° °Fortunately, your evaluation today is reassuring with no apparent emergent cause for your headache at this time. With that being said, it is VERY important that you monitor your symptoms at home. If you develop worsening headache, new fever, new neck stiffness, rash, weakness, numbness, trouble with your speech, trouble walking, new or worsening symptoms or any concerning symptoms, please return to the ED immediately.  °

## 2017-09-30 ENCOUNTER — Encounter: Payer: Self-pay | Admitting: Family Medicine

## 2017-09-30 ENCOUNTER — Ambulatory Visit (INDEPENDENT_AMBULATORY_CARE_PROVIDER_SITE_OTHER): Payer: BLUE CROSS/BLUE SHIELD | Admitting: Family Medicine

## 2017-09-30 VITALS — BP 132/78 | HR 92 | Temp 98.3°F | Resp 16 | Ht 66.0 in | Wt 204.0 lb

## 2017-09-30 DIAGNOSIS — R1011 Right upper quadrant pain: Secondary | ICD-10-CM

## 2017-09-30 DIAGNOSIS — Z79899 Other long term (current) drug therapy: Secondary | ICD-10-CM

## 2017-09-30 DIAGNOSIS — F988 Other specified behavioral and emotional disorders with onset usually occurring in childhood and adolescence: Secondary | ICD-10-CM

## 2017-09-30 MED ORDER — METHYLPHENIDATE HCL ER (OSM) 36 MG PO TBCR: 36.0000 mg | EXTENDED_RELEASE_TABLET | Freq: Every day | ORAL | 0 refills | Status: DC

## 2017-09-30 NOTE — Assessment & Plan Note (Signed)
stable °

## 2017-09-30 NOTE — Patient Instructions (Signed)
Low-Fat Diet for Pancreatitis or Gallbladder Conditions A low-fat diet can be helpful if you have pancreatitis or a gallbladder condition. With these conditions, your pancreas and gallbladder have trouble digesting fats. A healthy eating plan with less fat will help rest your pancreas and gallbladder and reduce your symptoms. What do I need to know about this diet?  Eat a low-fat diet. ? Reduce your fat intake to less than 20-30% of your total daily calories. This is less than 50-60 g of fat per day. ? Remember that you need some fat in your diet. Ask your dietician what your daily goal should be. ? Choose nonfat and low-fat healthy foods. Look for the words "nonfat," "low fat," or "fat free." ? As a guide, look on the label and choose foods with less than 3 g of fat per serving. Eat only one serving.  Avoid alcohol.  Do not smoke. If you need help quitting, talk with your health care provider.  Eat small frequent meals instead of three large heavy meals. What foods can I eat? Grains Include healthy grains and starches such as potatoes, wheat bread, fiber-rich cereal, and brown rice. Choose whole grain options whenever possible. In adults, whole grains should account for 45-65% of your daily calories. Fruits and Vegetables Eat plenty of fruits and vegetables. Fresh fruits and vegetables add fiber to your diet. Meats and Other Protein Sources Eat lean meat such as chicken and pork. Trim any fat off of meat before cooking it. Eggs, fish, and beans are other sources of protein. In adults, these foods should account for 10-35% of your daily calories. Dairy Choose low-fat milk and dairy options. Dairy includes fat and protein, as well as calcium. Fats and Oils Limit high-fat foods such as fried foods, sweets, baked goods, sugary drinks. Other Creamy sauces and condiments, such as mayonnaise, can add extra fat. Think about whether or not you need to use them, or use smaller amounts or low fat  options. What foods are not recommended?  High fat foods, such as: ? Baked goods. ? Ice cream. ? French toast. ? Sweet rolls. ? Pizza. ? Cheese bread. ? Foods covered with batter, butter, creamy sauces, or cheese. ? Fried foods. ? Sugary drinks and desserts.  Foods that cause gas or bloating This information is not intended to replace advice given to you by your health care provider. Make sure you discuss any questions you have with your health care provider. Document Released: 09/19/2013 Document Revised: 02/20/2016 Document Reviewed: 08/28/2013 Elsevier Interactive Patient Education  2017 [employer]  

## 2017-09-30 NOTE — Progress Notes (Signed)
Patient ID: [name], female   DOB: Feb 01, 1981, 37 y.o.   MRN: [id number]    Subjective:  I acted as a Education administrator for Dr. Carollee Herter.  [name], [name]   Patient ID: [name], female    DOB: September 12, 1981, 37 y.o.   MRN: [id number]  Chief Complaint  Patient presents with  . ADD    HPI  Patient is in today for follow up ADD.  Patient is doing well on current treatment. Pt also c/o RUQ pain with fatty foods.  Comes and goes.    Patient Care Team: Carollee Herter, Alferd Apa, DO as PCP - General Marylynn Pearson, MD as Consulting Physician (Obstetrics and Gynecology)   Past Medical History:  Diagnosis Date  . Anxiety   . Anxiety and depression   . Pre-eclampsia    2010    Past Surgical History:  Procedure Laterality Date  . CESAREAN SECTION     2010  . EYE SURGERY  12/25/2016   Lasix Eye Surgery    Family History  Problem Relation Age of Onset  . Hypertension Father   . Diabetes Father        Type 2  . Heart disease Paternal Grandmother   . Pancreatic cancer Paternal Grandfather     Social History   Socioeconomic History  . Marital status: Married    Spouse name: Not on file  . Number of children: Not on file  . Years of education: Not on file  . Highest education level: Not on file  Social Needs  . Financial resource strain: Not on file  . Food insecurity - worry: Not on file  . Food insecurity - inability: Not on file  . Transportation needs - medical: Not on file  . Transportation needs - non-medical: Not on file  Occupational History  . 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 Smoking status: Never Smoker  . Smokeless tobacco: Never Used  Substance and Sexual Activity  . Alcohol use: No  . Drug use: No  . Sexual activity: Yes    Partners: Male  Other Topics Concern  . Not on file  Social History Narrative  . Not on file    Outpatient Medications Prior to Visit  Medication Sig Dispense Refill  .  cetirizine (ZYRTEC) 10 MG tablet Take 10 mg by mouth daily.    Marland Kitchen escitalopram (LEXAPRO) 20 MG tablet Take 1 tablet (20 mg total) daily by mouth. 180 tablet 0  . methylphenidate (CONCERTA) 36 MG PO CR tablet Take 1 tablet (36 mg total) by mouth daily. October 2018 30 tablet 0  . methylphenidate (CONCERTA) 36 MG PO CR tablet Take 1 tablet (36 mg total) by mouth daily. 30 tablet 0  . methylphenidate (CONCERTA) 36 MG PO CR tablet Take 1 tablet (36 mg total) by mouth daily. 30 tablet 0   No facility-administered medications prior to visit.     Allergies  Allergen Reactions  . Sulfonamide Derivatives Swelling and Rash    Review of Systems  Constitutional: Negative for chills, fever and malaise/fatigue.  HENT: Negative for congestion and hearing loss.   Eyes: Negative for discharge.  Respiratory: Negative for cough, sputum production and shortness of breath.   Cardiovascular: Negative for chest pain, palpitations and leg swelling.  Gastrointestinal: Negative for abdominal pain, blood in stool, constipation, diarrhea, heartburn, nausea and vomiting.  Genitourinary: Negative for dysuria, frequency, hematuria and urgency.  Musculoskeletal: Negative for back pain, falls and myalgias.  Skin: Negative for rash.  Neurological: Negative for dizziness, sensory change, loss of consciousness, weakness and headaches.  Endo/Heme/Allergies: Negative for environmental allergies. Does not bruise/bleed easily.  Psychiatric/Behavioral: Negative for depression and suicidal ideas. The patient is not nervous/anxious and does not have insomnia.        Objective:    Physical Exam  Constitutional: She is oriented to person, place, and time. She appears well-developed and well-nourished.  HENT:  Head: Normocephalic and atraumatic.  Eyes: Conjunctivae and EOM are normal.  Neck: Normal range of motion. Neck supple. No JVD present. Carotid bruit is not present. No thyromegaly present.  Cardiovascular: Normal  rate, regular rhythm and normal heart sounds.  No murmur heard. Pulmonary/Chest: Effort normal and breath sounds normal. No respiratory distress. She has no wheezes. She has no rales. She exhibits no tenderness.  Abdominal: Soft. There is tenderness in the right upper quadrant. There is no rigidity, no rebound, no guarding, no tenderness at McBurney's point and negative Murphy's sign.  Musculoskeletal: She exhibits no edema.  Neurological: She is alert and oriented to person, place, and time.  Psychiatric: She has a normal mood and affect.  Nursing note and vitals reviewed.   BP 132/78 (BP Location: Right Arm, Cuff Size: Normal)   Pulse 92   Temp 98.3 F (36.8 C) (Oral)   Resp 16   Ht 5\' 6"  (1.676 m)   Wt 204 lb (92.5 kg)   SpO2 98%   BMI 32.93 kg/m  Wt Readings from Last 3 Encounters:  09/30/17 204 lb (92.5 kg)  09/11/17 175 lb (79.4 kg)  03/19/17 191 lb (86.6 kg)   BP Readings from Last 3 Encounters:  09/30/17 132/78  09/11/17 120/72  03/19/17 118/78     Immunization History  Administered Date(s) Administered  . Influenza Whole 06/21/2008  . Influenza,inj,Quad PF,6+ Mos 05/29/2016, 06/16/2017  . PPD Test 05/27/2016  . Tdap 05/11/2008, 05/11/2012    Health Maintenance  Topic Date Due  . HIV Screening  01/07/1997  . PAP SMEAR  04/22/2018  . TETANUS/TDAP  05/11/2022  . INFLUENZA VACCINE  Completed    Lab Results  Component Value Date   WBC 4.6 02/19/2017   HGB 12.9 02/19/2017   HCT 38.9 02/19/2017   PLT 298 02/19/2017   GLUCOSE 82 02/19/2017   CHOL 158 02/19/2017   TRIG 104 02/19/2017   HDL 74 02/19/2017   LDLCALC 63 02/19/2017   ALT 13 02/19/2017   AST 16 02/19/2017   NA 138 02/19/2017   K 4.2 02/19/2017   CL 103 02/19/2017   CREATININE 0.75 02/19/2017   BUN 13 02/19/2017   CO2 21 02/19/2017   TSH 1.33 02/19/2017    Lab Results  Component Value Date   TSH 1.33 02/19/2017   Lab Results  Component Value Date   WBC 4.6 02/19/2017   HGB 12.9  02/19/2017   HCT 38.9 02/19/2017   MCV 93.3 02/19/2017   PLT 298 02/19/2017   Lab Results  Component Value Date   NA 138 02/19/2017   K 4.2 02/19/2017   CO2 21 02/19/2017   GLUCOSE 82 02/19/2017   BUN 13 02/19/2017   CREATININE 0.75 02/19/2017   BILITOT 0.4 02/19/2017   ALKPHOS 47 02/19/2017   AST 16 02/19/2017   ALT 13 02/19/2017   PROT 6.8 02/19/2017   ALBUMIN 4.1 02/19/2017   CALCIUM 9.2 02/19/2017   GFR 87.23 02/04/2016   Lab Results  Component Value Date   CHOL 158 02/19/2017  Lab Results  Component Value Date   HDL 74 02/19/2017   Lab Results  Component Value Date   LDLCALC 63 02/19/2017   Lab Results  Component Value Date   TRIG 104 02/19/2017   Lab Results  Component Value Date   CHOLHDL 2.1 02/19/2017   No results found for: HGBA1C       Assessment & Plan:   Problem List Items Addressed This Visit      Unprioritized   Attention deficit disorder    stable      Relevant Medications   methylphenidate (CONCERTA) 36 MG PO CR tablet   Other Relevant Orders   Pain Mgmt, Profile 8 w/Conf, U    Other Visit Diagnoses    High risk medication use    -  Primary   Relevant Orders   Pain Mgmt, Profile 8 w/Conf, U   RUQ pain       Relevant Orders   US Abdomen Limited RUQ      I am having [name] maintain her cetirizine, escitalopram, and methylphenidate.  Meds ordered this encounter  Medications  . methylphenidate (CONCERTA) 36 MG PO CR tablet    Sig: Take 1 tablet (36 mg total) by mouth daily. October 2018    Dispense:  90 tablet    Refill:  0    CMA served as Education administrator during this visit. History, Physical and Plan performed by medical provider. Documentation and orders reviewed and attested to.  Ann Held, DO

## 2017-10-01 LAB — PAIN MGMT, PROFILE 8 W/CONF, U
6 Acetylmorphine: NEGATIVE ng/mL (ref <10)
Alcohol Metabolites: NEGATIVE ng/mL (ref <500)
Amphetamines: NEGATIVE ng/mL (ref <500)
BENZODIAZEPINES: NEGATIVE ng/mL (ref <100)
Buprenorphine, Urine: NEGATIVE ng/mL (ref <5)
Cocaine Metabolite: NEGATIVE ng/mL (ref <150)
Creatinine: 250.8 mg/dL
MDMA: NEGATIVE ng/mL (ref <500)
Marijuana Metabolite: NEGATIVE ng/mL (ref <20)
OXIDANT: NEGATIVE ug/mL (ref <200)
OXYCODONE: NEGATIVE ng/mL (ref <100)
Opiates: NEGATIVE ng/mL (ref <100)
pH: 6.09 (ref 4.5–9.0)

## 2017-10-03 ENCOUNTER — Ambulatory Visit (HOSPITAL_BASED_OUTPATIENT_CLINIC_OR_DEPARTMENT_OTHER)
Admission: RE | Admit: 2017-10-03 | Discharge: 2017-10-03 | Disposition: A | Discharge status: Home / Self Care | Payer: BLUE CROSS/BLUE SHIELD | Source: Ambulatory Visit | Attending: Family Medicine | Admitting: Family Medicine

## 2017-10-03 DIAGNOSIS — R1011 Right upper quadrant pain: Secondary | ICD-10-CM | POA: Diagnosis not present

## 2017-12-27 ENCOUNTER — Telehealth: Payer: Self-pay

## 2017-12-27 ENCOUNTER — Other Ambulatory Visit: Payer: Self-pay

## 2017-12-27 DIAGNOSIS — Z111 Encounter for screening for respiratory tuberculosis: Secondary | ICD-10-CM

## 2017-12-27 NOTE — Telephone Encounter (Signed)
Ok to do but we need reason to attach to it

## 2017-12-27 NOTE — Telephone Encounter (Signed)
Patient called office to see if she can come in to have [place]. tb test done in lab. No orders have been placed. Please advise.

## 2017-12-28 ENCOUNTER — Other Ambulatory Visit (INDEPENDENT_AMBULATORY_CARE_PROVIDER_SITE_OTHER): Payer: BLUE CROSS/BLUE SHIELD

## 2017-12-28 DIAGNOSIS — Z111 Encounter for screening for respiratory tuberculosis: Secondary | ICD-10-CM

## 2017-12-28 NOTE — Telephone Encounter (Signed)
Called patient. 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 Placed order for lab appointment to have drawn today.

## 2017-12-29 LAB — QUANTIFERON-TB GOLD PLUS
Mitogen-NIL: 8.59 IU/mL
NIL: 0.01 [IU]/mL
QuantiFERON-TB Gold Plus: NEGATIVE
TB1-NIL: 0 IU/mL
TB2-NIL: 0.02 IU/mL

## 2018-02-24 DIAGNOSIS — M5441 Lumbago with sciatica, right side: Secondary | ICD-10-CM | POA: Diagnosis not present

## 2018-02-24 DIAGNOSIS — S339XXA Sprain of unspecified parts of lumbar spine and pelvis, initial encounter: Secondary | ICD-10-CM | POA: Diagnosis not present

## 2018-03-01 ENCOUNTER — Other Ambulatory Visit: Payer: Self-pay | Admitting: Family Medicine

## 2018-03-29 DIAGNOSIS — M25521 Pain in right elbow: Secondary | ICD-10-CM | POA: Diagnosis not present

## 2018-04-05 ENCOUNTER — Encounter: Payer: Self-pay | Admitting: Family Medicine

## 2018-04-05 ENCOUNTER — Ambulatory Visit (INDEPENDENT_AMBULATORY_CARE_PROVIDER_SITE_OTHER): Payer: BLUE CROSS/BLUE SHIELD | Admitting: Family Medicine

## 2018-04-05 VITALS — BP 128/76 | HR 105 | Temp 98.1°F | Resp 16 | Ht 66.0 in | Wt 210.2 lb

## 2018-04-05 DIAGNOSIS — Z79899 Other long term (current) drug therapy: Secondary | ICD-10-CM

## 2018-04-05 DIAGNOSIS — F4323 Adjustment disorder with mixed anxiety and depressed mood: Secondary | ICD-10-CM | POA: Diagnosis not present

## 2018-04-05 DIAGNOSIS — Z0000 Encounter for general adult medical examination without abnormal findings: Secondary | ICD-10-CM

## 2018-04-05 DIAGNOSIS — Z6841 Body Mass Index (BMI) 40.0 and over, adult: Secondary | ICD-10-CM

## 2018-04-05 DIAGNOSIS — F988 Other specified behavioral and emotional disorders with onset usually occurring in childhood and adolescence: Secondary | ICD-10-CM

## 2018-04-05 MED ORDER — METHYLPHENIDATE HCL ER (OSM) 54 MG PO TBCR: 54.0000 mg | EXTENDED_RELEASE_TABLET | ORAL | 0 refills | Status: DC

## 2018-04-05 NOTE — Progress Notes (Signed)
Subjective:  I acted as a Education administrator for [employer]. Yancey Flemings, [place]   Patient ID: [name], female    DOB: April 28, 1981, 36 y.o.   MRN: [id number]  Chief Complaint  Patient presents with  . Annual Exam    HPI  Patient is in today for annual exam. No complaints   Patient Care Team: Carollee Herter, Alferd Apa, DO as PCP - General Marylynn Pearson, MD as Consulting Physician (Obstetrics and Gynecology)   Past Medical History:  Diagnosis Date  . Anxiety   . Anxiety and depression   . Pre-eclampsia    2010    Past Surgical History:  Procedure Laterality Date  . CESAREAN SECTION     2010  . EYE SURGERY  12/25/2016   Lasix Eye Surgery    Family History  Problem Relation Age of Onset  . Hypertension Father   . Diabetes Father        Type 2  . Heart disease Paternal Grandmother   . Pancreatic cancer Paternal Grandfather     Social History   Socioeconomic History  . Marital status: Married    Spouse name: Not on file  . Number of children: Not on file  . Years of education: Not on file  . Highest education level: Not on file  Occupational History  . 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 Financial resource strain: Not on file  . Food insecurity:    Worry: Not on file    Inability: Not on file  . Transportation needs:    Medical: Not on file    Non-medical: Not on file  Tobacco Use  . Smoking status: Never Smoker  . Smokeless tobacco: Never Used  Substance and Sexual Activity  . Alcohol use: No  . Drug use: No  . Sexual activity: Yes    Partners: Male  Lifestyle  . Physical activity:    Days per week: Not on file    Minutes per session: Not on file  . Stress: Not on file  Relationships  . Social connections:    Talks on phone: Not on file    Gets together: Not on file    Attends religious service: Not on file    Active member of club or organization: Not on file    Attends meetings of clubs or organizations:  Not on file    Relationship status: Not on file  . Intimate partner violence:    Fear of current or ex partner: Not on file    Emotionally abused: Not on file    Physically abused: Not on file    Forced sexual activity: Not on file  Other Topics Concern  . Not on file  Social History Narrative  . Not on file    Outpatient Medications Prior to Visit  Medication Sig Dispense Refill  . cetirizine (ZYRTEC) 10 MG tablet Take 10 mg by mouth daily.    Marland Kitchen escitalopram (LEXAPRO) 20 MG tablet TAKE 1 TABLET DAILY 180 tablet 0  . methylphenidate (CONCERTA) 36 MG PO CR tablet Take 1 tablet (36 mg total) by mouth daily. October 2018 90 tablet 0   No facility-administered medications prior to visit.     Allergies  Allergen Reactions  . Sulfonamide Derivatives Swelling and Rash    Review of Systems  Constitutional: Negative for chills, fever and malaise/fatigue.  HENT: Negative for congestion and hearing loss.   Eyes: Negative for discharge.  Respiratory: Negative for cough,  sputum production and shortness of breath.   Cardiovascular: Negative for chest pain, palpitations and leg swelling.  Gastrointestinal: Negative for abdominal pain, blood in stool, constipation, diarrhea, heartburn, nausea and vomiting.  Genitourinary: Negative for dysuria, frequency, hematuria and urgency.  Musculoskeletal: Negative for back pain, falls and myalgias.  Skin: Negative for rash.  Neurological: Negative for dizziness, sensory change, loss of consciousness, weakness and headaches.  Endo/Heme/Allergies: Negative for environmental allergies. Does not bruise/bleed easily.  Psychiatric/Behavioral: Negative for depression and suicidal ideas. The patient is not nervous/anxious and does not have insomnia.        Objective:    Physical Exam  Constitutional: She is oriented to person, place, and time. She appears well-developed and well-nourished.  HENT:  Head: Normocephalic and atraumatic.  Eyes: Conjunctivae  and EOM are normal.  Neck: Normal range of motion. Neck supple. No JVD present. Carotid bruit is not present. No thyromegaly present.  Cardiovascular: Normal rate, regular rhythm and normal heart sounds.  No murmur heard. Pulmonary/Chest: Effort normal and breath sounds normal. No respiratory distress. She has no wheezes. She has no rales. She exhibits no tenderness.  Musculoskeletal: She exhibits no edema.  Neurological: She is alert and oriented to person, place, and time.  Psychiatric: She has a normal mood and affect.  Nursing note and vitals reviewed.   BP 128/76 (BP Location: Left Arm, Patient Position: Sitting, Cuff Size: Large)   Pulse (!) 105   Temp 98.1 F (36.7 C) (Oral)   Resp 16   Ht 5\' 6"  (1.676 m)   Wt 210 lb 3.2 oz (95.3 kg)   SpO2 99%   BMI 33.93 kg/m  Wt Readings from Last 3 Encounters:  04/05/18 210 lb 3.2 oz (95.3 kg)  09/30/17 204 lb (92.5 kg)  09/11/17 175 lb (79.4 kg)   BP Readings from Last 3 Encounters:  04/05/18 128/76  09/30/17 132/78  09/11/17 120/72     Immunization History  Administered Date(s) Administered  . Influenza Whole 06/21/2008  . Influenza,inj,Quad PF,6+ Mos 05/29/2016, 06/16/2017  . PPD Test 05/27/2016  . Tdap 05/11/2008, 05/11/2012    Health Maintenance  Topic Date Due  . HIV Screening  01/07/1997  . PAP SMEAR  04/22/2018  . INFLUENZA VACCINE  04/28/2018  . TETANUS/TDAP  05/11/2022    Lab Results  Component Value Date   WBC 4.6 02/19/2017   HGB 12.9 02/19/2017   HCT 38.9 02/19/2017   PLT 298 02/19/2017   GLUCOSE 82 02/19/2017   CHOL 158 02/19/2017   TRIG 104 02/19/2017   HDL 74 02/19/2017   LDLCALC 63 02/19/2017   ALT 13 02/19/2017   AST 16 02/19/2017   NA 138 02/19/2017   K 4.2 02/19/2017   CL 103 02/19/2017   CREATININE 0.75 02/19/2017   BUN 13 02/19/2017   CO2 21 02/19/2017   TSH 1.33 02/19/2017    Lab Results  Component Value Date   TSH 1.33 02/19/2017   Lab Results  Component Value Date   WBC 4.6  02/19/2017   HGB 12.9 02/19/2017   HCT 38.9 02/19/2017   MCV 93.3 02/19/2017   PLT 298 02/19/2017   Lab Results  Component Value Date   NA 138 02/19/2017   K 4.2 02/19/2017   CO2 21 02/19/2017   GLUCOSE 82 02/19/2017   BUN 13 02/19/2017   CREATININE 0.75 02/19/2017   BILITOT 0.4 02/19/2017   ALKPHOS 47 02/19/2017   AST 16 02/19/2017   ALT 13 02/19/2017   PROT 6.8  02/19/2017   ALBUMIN 4.1 02/19/2017   CALCIUM 9.2 02/19/2017   GFR 87.23 02/04/2016   Lab Results  Component Value Date   CHOL 158 02/19/2017   Lab Results  Component Value Date   HDL 74 02/19/2017   Lab Results  Component Value Date   LDLCALC 63 02/19/2017   Lab Results  Component Value Date   TRIG 104 02/19/2017   Lab Results  Component Value Date   CHOLHDL 2.1 02/19/2017   No results found for: HGBA1C       Assessment & Plan:   Problem List Items Addressed This Visit      Unprioritized   Adjustment disorder with mixed anxiety and depressed mood    Stable        Attention deficit disorder    Pt requested to inc dose---  Doing well with med      Preventative health care    ghm utd Check labs See AVS      Relevant Orders   CBC with Differential/Platelet   Comprehensive metabolic panel   Lipid panel   TSH    Other Visit Diagnoses    High risk medication use    -  Primary   Relevant Orders   Pain Mgmt, Profile 8 w/Conf, U   BMI 40.0-44.9, adult (HCC)       Relevant Medications   methylphenidate (CONCERTA) 54 MG PO CR tablet   Other Relevant Orders   Amb Ref to Medical Weight Management      I am having [name] maintain her cetirizine, escitalopram, and methylphenidate.  Meds ordered this encounter  Medications  . DISCONTD: methylphenidate (CONCERTA) 54 MG PO CR tablet    Sig: Take 1 tablet (54 mg total) by mouth every morning.    Dispense:  30 tablet    Refill:  0  . methylphenidate (CONCERTA) 54 MG PO CR tablet    Sig: Take 1 tablet (54 mg total) by mouth  every morning.    Dispense:  90 tablet    Refill:  0    CMA served as scribe during this visit. History, Physical and Plan performed by medical provider. Documentation and orders reviewed and attested to.  Ann Held, DO

## 2018-04-05 NOTE — Patient Instructions (Signed)
Preventive Care 18-39 Years, Female Preventive care refers to lifestyle choices and visits with your health care provider that can promote health and wellness. What does preventive care include?  A yearly physical exam. This is also called an annual well check.  Dental exams once or twice a year.  Routine eye exams. Ask your health care provider how often you should have your eyes checked.  Personal lifestyle choices, including: ? Daily care of your teeth and gums. ? Regular physical activity. ? Eating a healthy diet. ? Avoiding tobacco and drug use. ? Limiting alcohol use. ? Practicing safe sex. ? Taking vitamin and mineral supplements as recommended by your health care provider. What happens during an annual well check? The services and screenings done by your health care provider during your annual well check will depend on your age, overall health, lifestyle risk factors, and family history of disease. Counseling Your health care provider may ask you questions about your:  Alcohol use.  Tobacco use.  Drug use.  Emotional well-being.  Home and relationship well-being.  Sexual activity.  Eating habits.  Work and work Statistician.  Method of birth control.  Menstrual cycle.  Pregnancy history.  Screening You may have the following tests or measurements:  Height, weight, and BMI.  Diabetes screening. This is done by checking your blood sugar (glucose) after you have not eaten for a while (fasting).  Blood pressure.  Lipid and cholesterol levels. These may be checked every 5 years starting at age 66.  Skin check.  Hepatitis C blood test.  Hepatitis B blood test.  Sexually transmitted disease (STD) testing.  BRCA-related cancer screening. This may be done if you have a family history of breast, ovarian, tubal, or peritoneal cancers.  Pelvic exam and Pap test. This may be done every 3 years starting at age 40. Starting at age 59, this may be done every 5  years if you have a Pap test in combination with an HPV test.  Discuss your test results, treatment options, and if necessary, the need for more tests with your health care provider. Vaccines Your health care provider may recommend certain vaccines, such as:  Influenza vaccine. This is recommended every year.  Tetanus, diphtheria, and acellular pertussis (Tdap, Td) vaccine. You may need a Td booster every 10 years.  Varicella vaccine. You may need this if you have not been vaccinated.  HPV vaccine. If you are 69 or younger, you may need three doses over 6 months.  Measles, mumps, and rubella (MMR) vaccine. You may need at least one dose of MMR. You may also need a second dose.  Pneumococcal 13-valent conjugate (PCV13) vaccine. You may need this if you have certain conditions and were not previously vaccinated.  Pneumococcal polysaccharide (PPSV23) vaccine. You may need one or two doses if you smoke cigarettes or if you have certain conditions.  Meningococcal vaccine. One dose is recommended if you are age 27-21 years and a first-year college student living in a residence hall, or if you have one of several medical conditions. You may also need additional booster doses.  Hepatitis A vaccine. You may need this if you have certain conditions or if you travel or work in places where you may be exposed to hepatitis A.  Hepatitis B vaccine. You may need this if you have certain conditions or if you travel or work in places where you may be exposed to hepatitis B.  Haemophilus influenzae type b (Hib) vaccine. You may need this if  you have certain risk factors.  Talk to your health care provider about which screenings and vaccines you need and how often you need them. This information is not intended to replace advice given to you by your health care provider. Make sure you discuss any questions you have with your health care provider. Document Released: 11/10/2001 Document Revised: 06/03/2016  Document Reviewed: 07/16/2015 Elsevier Interactive Patient Education  [employer].

## 2018-04-06 LAB — PAIN MGMT, PROFILE 8 W/CONF, U
6 Acetylmorphine: NEGATIVE ng/mL (ref <10)
AMPHETAMINES: NEGATIVE ng/mL (ref <500)
Alcohol Metabolites: NEGATIVE ng/mL (ref <500)
BUPRENORPHINE, URINE: NEGATIVE ng/mL (ref <5)
Benzodiazepines: NEGATIVE ng/mL (ref <100)
Cocaine Metabolite: NEGATIVE ng/mL (ref <150)
Creatinine: 79 mg/dL
MDMA: NEGATIVE ng/mL (ref <500)
Marijuana Metabolite: NEGATIVE ng/mL (ref <20)
OXIDANT: NEGATIVE ug/mL (ref <200)
OXYCODONE: NEGATIVE ng/mL (ref <100)
Opiates: NEGATIVE ng/mL (ref <100)
pH: 5.84 (ref 4.5–9.0)

## 2018-04-06 NOTE — Assessment & Plan Note (Addendum)
Stable

## 2018-04-06 NOTE — Assessment & Plan Note (Signed)
ghm utd Check labs See AVS 

## 2018-04-06 NOTE — Assessment & Plan Note (Signed)
Pt requested to inc dose---  Doing well with med

## 2018-04-07 ENCOUNTER — Other Ambulatory Visit (INDEPENDENT_AMBULATORY_CARE_PROVIDER_SITE_OTHER): Payer: BLUE CROSS/BLUE SHIELD

## 2018-04-07 DIAGNOSIS — Z0000 Encounter for general adult medical examination without abnormal findings: Secondary | ICD-10-CM

## 2018-04-07 LAB — COMPREHENSIVE METABOLIC PANEL
ALT: 20 U/L (ref 0–35)
AST: 17 U/L (ref 0–37)
Albumin: 3.9 g/dL (ref 3.5–5.2)
Alkaline Phosphatase: 53 U/L (ref 39–117)
BUN: 13 mg/dL (ref 6–23)
CHLORIDE: 104 meq/L (ref 96–112)
CO2: 27 mEq/L (ref 19–32)
Calcium: 9.1 mg/dL (ref 8.4–10.5)
Creatinine, Ser: 0.87 mg/dL (ref 0.40–1.20)
GFR: 78.2 mL/min (ref >60.00)
GLUCOSE: 89 mg/dL (ref 70–99)
POTASSIUM: 5 meq/L (ref 3.5–5.1)
Sodium: 140 mEq/L (ref 135–145)
Total Bilirubin: 0.3 mg/dL (ref 0.2–1.2)
Total Protein: 6.6 g/dL (ref 6.0–8.3)

## 2018-04-07 LAB — CBC WITH DIFFERENTIAL/PLATELET
BASOS PCT: 0.7 % (ref 0.0–3.0)
Basophils Absolute: 0 10*3/uL (ref 0.0–0.1)
EOS PCT: 1.7 % (ref 0.0–5.0)
Eosinophils Absolute: 0.1 10*3/uL (ref 0.0–0.7)
HCT: 38.3 % (ref 36.0–46.0)
Hemoglobin: 12.8 g/dL (ref 12.0–15.0)
LYMPHS ABS: 1.7 10*3/uL (ref 0.7–4.0)
Lymphocytes Relative: 31.2 % (ref 12.0–46.0)
MCHC: 33.4 g/dL (ref 30.0–36.0)
MCV: 89.8 fl (ref 78.0–100.0)
MONOS PCT: 6.1 % (ref 3.0–12.0)
Monocytes Absolute: 0.3 10*3/uL (ref 0.1–1.0)
NEUTROS ABS: 3.3 10*3/uL (ref 1.4–7.7)
NEUTROS PCT: 60.3 % (ref 43.0–77.0)
PLATELETS: 380 10*3/uL (ref 150.0–400.0)
RBC: 4.26 Mil/uL (ref 3.87–5.11)
RDW: 14.2 % (ref 11.5–15.5)
WBC: 5.4 10*3/uL (ref 4.0–10.5)

## 2018-04-07 LAB — TSH: TSH: 2.29 u[IU]/mL (ref 0.35–4.50)

## 2018-04-07 LAB — LIPID PANEL
CHOL/HDL RATIO: 3
Cholesterol: 176 mg/dL (ref 0–200)
HDL: 70 mg/dL (ref >39.00)
LDL Cholesterol: 79 mg/dL (ref 0–99)
NONHDL: 105.77
Triglycerides: 135 mg/dL (ref 0.0–149.0)
VLDL: 27 mg/dL (ref 0.0–40.0)

## 2018-04-22 DIAGNOSIS — F4320 Adjustment disorder, unspecified: Secondary | ICD-10-CM | POA: Diagnosis not present

## 2018-04-29 DIAGNOSIS — F4320 Adjustment disorder, unspecified: Secondary | ICD-10-CM | POA: Diagnosis not present

## 2018-05-03 ENCOUNTER — Encounter (HOSPITAL_COMMUNITY): Payer: Self-pay | Admitting: Emergency Medicine

## 2018-05-03 ENCOUNTER — Emergency Department (HOSPITAL_COMMUNITY)
Admission: EM | Admit: 2018-05-03 | Discharge: 2018-05-04 | Disposition: A | Discharge status: Home / Self Care | Payer: BLUE CROSS/BLUE SHIELD | Attending: Emergency Medicine | Admitting: Emergency Medicine

## 2018-05-03 DIAGNOSIS — R51 Headache: Secondary | ICD-10-CM | POA: Diagnosis not present

## 2018-05-03 DIAGNOSIS — Z79899 Other long term (current) drug therapy: Secondary | ICD-10-CM | POA: Diagnosis not present

## 2018-05-03 DIAGNOSIS — G43009 Migraine without aura, not intractable, without status migrainosus: Secondary | ICD-10-CM | POA: Diagnosis not present

## 2018-05-03 NOTE — ED Provider Notes (Signed)
Patient placed in Quick Look pathway, seen and evaluated   Chief Complaint: Headache  HPI: Patient with 4-day history of gradual onset, waxing and waning right-sided headache.  States it starts behind the eye and radiates up to the right temple.  States this feels similar to a headache that she had in December for which she was seen and evaluated in the ED.  Denies recent trauma or falls.  Endorses phonophobia and photophobia.  Denies vision changes, numbness, weakness, difficulty speaking or swallowing or ambulating.  No fevers or chills.  Has tried ibuprofen without relief of her symptoms.  ROS: Positive for headache, photophobia, photophobia Negative for numbness, weakness, syncope, vision changes  Physical Exam:   Gen: No distress  Psych: Appropriate mood and affect  Skin: Warm    Focused Exam:   Mental Status:  Alert, thought content appropriate, able to give a coherent history. Speech fluent without evidence of aphasia. Able to follow 2 step commands without difficulty.  Cranial Nerves:  II:  Peripheral visual fields grossly normal, pupils equal, round, reactive to light III,IV, VI: ptosis not present, extra-ocular motions intact bilaterally  V,VII: smile symmetric, facial light touch sensation equal VIII: hearing grossly normal to voice  X: uvula elevates symmetrically  XI: bilateral shoulder shrug symmetric and strong XII: midline tongue extension without fassiculations Motor:  Normal tone. 5/5 strength of BUE and BLE major muscle groups including strong and equal grip strength and dorsiflexion/plantar flexion Sensory: light touch normal in all extremities. Cerebellar: normal finger-to-nose with bilateral upper extremities Gait: normal gait and balance. Able to walk on toes and heels with ease.    No pronator drift, no nystagmus    Initiation of care has begun. The patient has been counseled on the process, plan, and necessity for staying for the completion/evaluation, and  the remainder of the medical screening examination    Debroah Baller 05/03/18 2008    Little, Wenda Overland, MD 05/04/18 0020

## 2018-05-03 NOTE — ED Triage Notes (Signed)
Patient reports right side migraine headache for 4 days with photophobia , denies emesis or fever.

## 2018-05-04 ENCOUNTER — Telehealth: Payer: Self-pay

## 2018-05-04 ENCOUNTER — Emergency Department (HOSPITAL_COMMUNITY): Payer: BLUE CROSS/BLUE SHIELD

## 2018-05-04 ENCOUNTER — Encounter (HOSPITAL_COMMUNITY): Payer: Self-pay | Admitting: Emergency Medicine

## 2018-05-04 DIAGNOSIS — R51 Headache: Secondary | ICD-10-CM | POA: Diagnosis not present

## 2018-05-04 LAB — I-STAT BETA HCG BLOOD, ED (MC, WL, AP ONLY)

## 2018-05-04 MED ORDER — DEXAMETHASONE SODIUM PHOSPHATE 10 MG/ML IJ SOLN
10.0000 mg | Freq: Once | INTRAMUSCULAR | Status: AC
  Administered 2018-05-04: 10 mg via INTRAVENOUS
  Filled 2018-05-04: qty 1

## 2018-05-04 MED ORDER — KETOROLAC TROMETHAMINE 30 MG/ML IJ SOLN
30.0000 mg | Freq: Once | INTRAMUSCULAR | Status: AC
  Administered 2018-05-04: 30 mg via INTRAVENOUS
  Filled 2018-05-04: qty 1

## 2018-05-04 MED ORDER — SODIUM CHLORIDE 0.9 % IV BOLUS
500.0000 mL | Freq: Once | INTRAVENOUS | Status: AC
  Administered 2018-05-04: 500 mL via INTRAVENOUS

## 2018-05-04 MED ORDER — METOCLOPRAMIDE HCL 10 MG PO TABS: 10.0000 mg | ORAL_TABLET | Freq: Four times a day (QID) | ORAL | 0 refills | Status: DC | PRN

## 2018-05-04 MED ORDER — DIPHENHYDRAMINE HCL 50 MG/ML IJ SOLN
12.5000 mg | Freq: Once | INTRAMUSCULAR | Status: AC
  Administered 2018-05-04: 12.5 mg via INTRAVENOUS
  Filled 2018-05-04: qty 1

## 2018-05-04 MED ORDER — METOCLOPRAMIDE HCL 5 MG/ML IJ SOLN
10.0000 mg | Freq: Once | INTRAMUSCULAR | Status: AC
  Administered 2018-05-04: 10 mg via INTRAVENOUS
  Filled 2018-05-04: qty 2

## 2018-05-04 NOTE — ED Provider Notes (Signed)
[facility]ENC[facility] DEPARTMENT Provider Note   CSN: [id number] Arrival date & time: 05/03/18  1932     History   Chief Complaint Chief Complaint  Patient presents with  . Migraine    HPI [name] is a 37 y.o. female.  The history is provided by the patient.  Migraine  This is a recurrent problem. The current episode started more than 2 days ago (4 days right parietal ). The problem occurs constantly. The problem has not changed since onset.Associated symptoms include headaches. Pertinent negatives include no chest pain, no abdominal pain and no shortness of breath. Nothing aggravates the symptoms. Nothing relieves the symptoms. Treatments tried: excedrin. The treatment provided no relief.    Past Medical History:  Diagnosis Date  . Anxiety   . Anxiety and depression   . Pre-eclampsia    2010    Patient Active Problem List   Diagnosis Date Noted  . Attention deficit disorder 03/19/2017  . Adjustment disorder with mixed anxiety and depressed mood 03/19/2017  . Preventative health care 02/20/2017  . Acute cystitis with hematuria 07/06/2014  . Anxiety 05/11/2012  . SYMPTOM, INSOMNIA NOS 06/08/2007    Past Surgical History:  Procedure Laterality Date  . CESAREAN SECTION     2010  . EYE SURGERY  12/25/2016   [facility]     OB History   None      Home Medications    Prior to Admission medications   Medication Sig Start Date End Date Taking? Authorizing Provider  ALPRAZolam Duanne Moron) 0.5 MG tablet Take 1 tablet by mouth at bedtime as needed for anxiety or sleep.    Yes [provider]  aspirin-acetaminophen-caffeine (EXCEDRIN MIGRAINE) [phone] MG tablet Take 2 tablets by mouth every 6 (six) hours as needed for headache.   Yes [provider]  cetirizine (ZYRTEC) 10 MG tablet Take 10 mg by mouth daily.   Yes [provider]  escitalopram (LEXAPRO) 20 MG tablet TAKE 1 TABLET DAILY 03/01/18  Yes Roma Schanz R, DO  ibuprofen (ADVIL,MOTRIN) 800 MG tablet Take 800 mg by mouth every 8 (eight) hours as needed for moderate pain.   Yes [provider]  Levonorgestrel-Ethinyl Estradiol (AMETHIA,CAMRESE) 0.15-0.03 &0.01 MG tablet Take 1 tablet by mouth daily. 04/19/18  Yes [provider]  methylphenidate (CONCERTA) 54 MG PO CR tablet Take 1 tablet (54 mg total) by mouth every morning. 04/05/18  Yes Ann Held, DO  Multiple Vitamin (MULTIVITAMIN WITH MINERALS) TABS tablet Take 1 tablet by mouth daily.   Yes [provider]    Family History Family History  Problem Relation Age of Onset  . Hypertension Father   . Diabetes Father        Type 2  . Heart disease Paternal Grandmother   . Pancreatic cancer Paternal Grandfather     Social History Social History   Tobacco Use  . Smoking status: Never Smoker  . Smokeless tobacco: Never Used  Substance Use Topics  . Alcohol use: No  . Drug use: No     Allergies   Sulfonamide derivatives   Review of Systems Review of Systems  Constitutional: Negative for chills and fever.  Eyes: Negative for photophobia and visual disturbance.  Respiratory: Negative for shortness of breath.   Cardiovascular: Negative for chest pain.  Gastrointestinal: Negative for abdominal pain, nausea and vomiting.  Musculoskeletal: Negative for back pain, neck pain and neck stiffness.  Neurological: Positive for headaches. Negative  for dizziness, tremors, seizures, syncope, facial asymmetry, speech difficulty, light-headedness and numbness.  All other systems reviewed and are negative.    Physical Exam Updated Vital Signs BP (!) 130/96 (BP Location: Right Arm)   Pulse 72   Temp 98.8 F (37.1 C) (Oral)   Resp 16   Ht 5\' 6"  (1.676 m)   Wt 92.5 kg (204 lb)   LMP 04/19/2018 (Approximate)   SpO2 99%   BMI 32.93 kg/m   Physical Exam  Constitutional: She is oriented to person, place, and time. She appears  well-developed and well-nourished. No distress.  HENT:  Head: Normocephalic and atraumatic.  Mouth/Throat: Oropharynx is clear and moist. No oropharyngeal exudate.  Eyes: Pupils are equal, round, and reactive to light. Conjunctivae and EOM are normal.  Neck: Normal range of motion. Neck supple.  Cardiovascular: Normal rate, regular rhythm, normal heart sounds and intact distal pulses.  Pulmonary/Chest: Effort normal and breath sounds normal. No stridor. She has no wheezes. She has no rales.  Abdominal: Soft. Bowel sounds are normal. She exhibits no mass. There is no tenderness. There is no rebound and no guarding.  Musculoskeletal: Normal range of motion.  Neurological: She is alert and oriented to person, place, and time. She displays normal reflexes. No cranial nerve deficit.  Skin: Skin is warm and dry. Capillary refill takes less than 2 seconds.  Psychiatric: She has a normal mood and affect.     ED Treatments / Results  Labs (all labs ordered are listed, but only abnormal results are displayed) Results for orders placed or performed during the hospital encounter of 05/03/18  I-Stat Beta hCG blood, ED ([facility], [facility], [facility] only)  Result Value Ref Range   I-stat hCG, quantitative <5.0 <5 mIU/mL   Comment 3           Ct Head Wo Contrast  Result Date: 05/04/2018 CLINICAL DATA:  Acute onset of right-sided headache. EXAM: CT HEAD WITHOUT CONTRAST TECHNIQUE: Contiguous axial images were obtained from the base of the skull through the vertex without intravenous contrast. COMPARISON:  CT of the head performed 12/10/2004 FINDINGS: Brain: No evidence of acute infarction, hemorrhage, hydrocephalus, extra-axial collection or mass lesion/mass effect. The posterior fossa, including the cerebellum, brainstem and fourth ventricle, is within normal limits. The third and lateral ventricles, and basal ganglia are unremarkable in appearance. The cerebral hemispheres are symmetric in appearance, with normal  gray-white differentiation. No mass effect or midline shift is seen. Vascular: No hyperdense vessel or unexpected calcification. Skull: There is no evidence of fracture; visualized osseous structures are unremarkable in appearance. Sinuses/Orbits: The orbits are within normal limits. The paranasal sinuses and mastoid air cells are well-aerated. Other: No significant soft tissue abnormalities are seen. IMPRESSION: Unremarkable noncontrast CT of the head. Electronically Signed   By: Garald Balding M.D.   On: 05/04/2018 06:06    Procedures Procedures (including critical care time)  Medications Ordered in ED Medications  ketorolac (TORADOL) 30 MG/ML injection 30 mg (has no administration in time range)  sodium chloride 0.9 % bolus 500 mL (has no administration in time range)  dexamethasone (DECADRON) injection 10 mg (has no administration in time range)  metoCLOPramide (REGLAN) injection 10 mg (has no administration in time range)  diphenhydrAMINE (BENADRYL) injection 12.5 mg (has no administration in time range)       Final Clinical Impressions(s) / ED Diagnoses   Return for pain, numbness, changes in vision or speech, fevers >100.4 unrelieved by medication, shortness of breath, intractable vomiting, or  diarrhea, abdominal pain, Inability to tolerate liquids or food, cough, altered mental status or any concerns. No signs of systemic illness or infection. The patient is nontoxic-appearing on exam and vital signs are within normal limits. Will refer to urology for microscopy hematuria as patient is asymptomatic.  I have reviewed the triage vital signs and the nursing notes. Pertinent labs &imaging results that were available during my care of the patient were reviewed by me and considered in my medical decision making (see chart for details).  After history, exam, and medical workup I feel the patient has been appropriately medically screened and is safe for discharge home. Pertinent  diagnoses were discussed with the patient. Patient was given return precautions.   Makinzee Durley, MD 05/04/18 [phone]

## 2018-05-04 NOTE — Telephone Encounter (Signed)
Called patient to schedule ED follow up visit with Dr. Lawson Radar. Left message on answering machine for return call.

## 2018-05-05 DIAGNOSIS — Z01419 Encounter for gynecological examination (general) (routine) without abnormal findings: Secondary | ICD-10-CM | POA: Diagnosis not present

## 2018-05-05 DIAGNOSIS — Z6833 Body mass index (BMI) 33.0-33.9, adult: Secondary | ICD-10-CM | POA: Diagnosis not present

## 2018-05-05 NOTE — Progress Notes (Signed)
Called patient regarding ED follow up. States she is following up with GYN and if she cannot do anything about it  She will call Dr. Carollee Herter.

## 2018-05-13 DIAGNOSIS — F4320 Adjustment disorder, unspecified: Secondary | ICD-10-CM | POA: Diagnosis not present

## 2018-05-20 DIAGNOSIS — F4320 Adjustment disorder, unspecified: Secondary | ICD-10-CM | POA: Diagnosis not present

## 2018-06-03 DIAGNOSIS — F4320 Adjustment disorder, unspecified: Secondary | ICD-10-CM | POA: Diagnosis not present

## 2018-06-09 ENCOUNTER — Encounter (INDEPENDENT_AMBULATORY_CARE_PROVIDER_SITE_OTHER): Payer: BLUE CROSS/BLUE SHIELD

## 2018-06-13 ENCOUNTER — Ambulatory Visit (INDEPENDENT_AMBULATORY_CARE_PROVIDER_SITE_OTHER): Payer: BLUE CROSS/BLUE SHIELD

## 2018-06-13 DIAGNOSIS — Z23 Encounter for immunization: Secondary | ICD-10-CM

## 2018-06-13 DIAGNOSIS — F4320 Adjustment disorder, unspecified: Secondary | ICD-10-CM | POA: Diagnosis not present

## 2018-06-13 DIAGNOSIS — Z0000 Encounter for general adult medical examination without abnormal findings: Secondary | ICD-10-CM

## 2018-06-16 ENCOUNTER — Encounter (INDEPENDENT_AMBULATORY_CARE_PROVIDER_SITE_OTHER): Payer: Self-pay | Admitting: Family Medicine

## 2018-06-16 ENCOUNTER — Ambulatory Visit (INDEPENDENT_AMBULATORY_CARE_PROVIDER_SITE_OTHER): Payer: BLUE CROSS/BLUE SHIELD | Admitting: Family Medicine

## 2018-06-16 VITALS — BP 116/79 | HR 76 | Temp 98.2°F | Ht 66.0 in | Wt 200.0 lb

## 2018-06-16 DIAGNOSIS — R0602 Shortness of breath: Secondary | ICD-10-CM

## 2018-06-16 DIAGNOSIS — Z1331 Encounter for screening for depression: Secondary | ICD-10-CM

## 2018-06-16 DIAGNOSIS — R5383 Other fatigue: Secondary | ICD-10-CM | POA: Diagnosis not present

## 2018-06-16 DIAGNOSIS — Z9189 Other specified personal risk factors, not elsewhere classified: Secondary | ICD-10-CM | POA: Diagnosis not present

## 2018-06-16 DIAGNOSIS — E669 Obesity, unspecified: Secondary | ICD-10-CM

## 2018-06-16 DIAGNOSIS — Z6832 Body mass index (BMI) 32.0-32.9, adult: Secondary | ICD-10-CM

## 2018-06-16 DIAGNOSIS — Z0289 Encounter for other administrative examinations: Secondary | ICD-10-CM

## 2018-06-17 LAB — COMPREHENSIVE METABOLIC PANEL
ALBUMIN: 4.4 g/dL (ref 3.5–5.5)
ALK PHOS: 74 IU/L (ref 39–117)
ALT: 68 IU/L — ABNORMAL HIGH (ref 0–32)
AST: 44 IU/L — ABNORMAL HIGH (ref 0–40)
Albumin/Globulin Ratio: 1.8 (ref 1.2–2.2)
BUN / CREAT RATIO: 12 (ref 9–23)
BUN: 10 mg/dL (ref 6–20)
Bilirubin Total: 0.5 mg/dL (ref 0.0–1.2)
CALCIUM: 9.5 mg/dL (ref 8.7–10.2)
CO2: 21 mmol/L (ref 20–29)
CREATININE: 0.84 mg/dL (ref 0.57–1.00)
Chloride: 97 mmol/L (ref 96–106)
GFR, EST AFRICAN AMERICAN: 103 mL/min/{1.73_m2} (ref >59)
GFR, EST NON AFRICAN AMERICAN: 90 mL/min/{1.73_m2} (ref >59)
GLUCOSE: 73 mg/dL (ref 65–99)
Globulin, Total: 2.4 g/dL (ref 1.5–4.5)
Potassium: 4.4 mmol/L (ref 3.5–5.2)
Sodium: 139 mmol/L (ref 134–144)
TOTAL PROTEIN: 6.8 g/dL (ref 6.0–8.5)

## 2018-06-17 LAB — LIPID PANEL WITH LDL/HDL RATIO
CHOLESTEROL TOTAL: 196 mg/dL (ref 100–199)
HDL: 64 mg/dL (ref >39)
LDL Calculated: 104 mg/dL — ABNORMAL HIGH (ref 0–99)
LDl/HDL Ratio: 1.6 ratio (ref 0.0–3.2)
TRIGLYCERIDES: 139 mg/dL (ref 0–149)
VLDL CHOLESTEROL CAL: 28 mg/dL (ref 5–40)

## 2018-06-17 LAB — FOLATE

## 2018-06-17 LAB — T4, FREE: FREE T4: 1.43 ng/dL (ref 0.82–1.77)

## 2018-06-17 LAB — INSULIN, RANDOM: INSULIN: 11.9 u[IU]/mL (ref 2.6–24.9)

## 2018-06-17 LAB — T3: T3, Total: 108 ng/dL (ref 71–180)

## 2018-06-17 LAB — HEMOGLOBIN A1C
Est. average glucose Bld gHb Est-mCnc: 108 mg/dL
Hgb A1c MFr Bld: 5.4 % (ref 4.8–5.6)

## 2018-06-17 LAB — VITAMIN D 25 HYDROXY (VIT D DEFICIENCY, FRACTURES): Vit D, 25-Hydroxy: 24.2 ng/mL — ABNORMAL LOW (ref 30.0–100.0)

## 2018-06-17 LAB — TSH: TSH: 2.68 u[IU]/mL (ref 0.450–4.500)

## 2018-06-17 LAB — VITAMIN B12: Vitamin B-12: 1775 pg/mL — ABNORMAL HIGH (ref 232–1245)

## 2018-06-21 NOTE — Progress Notes (Signed)
Office: [phone]  /  Fax: [phone]   Dear Dr. Carollee Herter,   Thank you for referring [name] to our clinic. The following note includes my evaluation and treatment recommendations.  HPI:   Chief Complaint: OBESITY    [name] has been referred by Ann Held, DO for consultation regarding her obesity and obesity related comorbidities.    [name] (MR# [id number]) is a 37 y.o. female who presents on 06/16/2018 for obesity evaluation and treatment. Current BMI is Body mass index is 32.28 kg/m.Marland Kitchen [name] has been struggling with her weight for many years and has been unsuccessful in either losing weight, maintaining weight loss, or reaching her healthy weight goal.     [name] attended our information session and states she is currently in the action stage of change and ready to dedicate time achieving and maintaining a healthier weight. [name] is interested in becoming our patient and working on intensive lifestyle modifications including (but not limited to) diet, exercise and weight loss.    [name] states her family eats meals together she thinks her family will eat healthier with  her her desired weight loss is 50 lbs she started gaining weight after 37 years old her heaviest weight ever was 210 lbs she has significant food cravings issues  she snacks frequently in the evenings she is frequently drinking liquids with calories she frequently makes poor food choices she frequently eats larger portions than normal  she struggles with emotional eating    [name] feels her energy is lower than it should be. This has worsened with weight gain and has not worsened recently. [name] admits to daytime somnolence and  admits to waking up still tired. Patient is at risk for obstructive sleep apnea. Patent has a history of symptoms of daytime Olivia and morning headache. Patient generally gets 6 hours of sleep per night, and  states they generally have nightime awakenings. Snoring is present. Apneic episodes are not present. Epworth Sleepiness Score is 7.  Dyspnea on exertion [name] notes increasing shortness of breath with exercising and seems to be worsening over time with weight gain. She notes getting out of breath sooner with activity than she used to. This has not gotten worse recently. [name] denies orthopnea.  Depression Screen [name] (modified PHQ-9) score was  Depression screen PHQ 2/9 06/16/2018  Decreased Interest 1  Down, Depressed, Hopeless 1  PHQ - 2 Score 2  Altered sleeping 2  Tired, decreased energy 3  Change in appetite 2  Feeling bad or failure about yourself  1  Trouble concentrating 2  Moving slowly or fidgety/restless 0  Suicidal thoughts 0  PHQ-9 Score 12  Difficult doing work/chores Not difficult at all   At risk for cardiovascular disease [name] is at a higher than average risk for cardiovascular disease due to obesity. She currently denies any chest pain.  ALLERGIES: Allergies  Allergen Reactions  . Sulfonamide Derivatives Swelling and Rash    MEDICATIONS: Current Outpatient Medications on File Prior to Visit  Medication Sig Dispense Refill  . escitalopram (LEXAPRO) 20 MG tablet TAKE 1 TABLET DAILY 180 tablet 0  . methylphenidate (CONCERTA) 54 MG PO CR tablet Take 1 tablet (54 mg total) by mouth every morning. 90 tablet 0  . norethindrone (MICRONOR,CAMILA,ERRIN) 0.35 MG tablet Take 1 tablet by mouth daily.     No current facility-administered medications on file prior to visit.     PAST MEDICAL HISTORY: Past Medical History:  Diagnosis  Date  . ADHD   . Anemia   . Anxiety   . Anxiety and depression   . History of gestational hypertension   . Leukopenia   . Pre-eclampsia    2010    PAST SURGICAL HISTORY: Past Surgical History:  Procedure Laterality Date  . CESAREAN SECTION     2010  . EYE SURGERY  12/25/2016   Lasix [facility]     SOCIAL HISTORY: Social History   Tobacco Use  . Smoking status: Never Smoker  . Smokeless tobacco: Never Used  Substance Use Topics  . Alcohol use: No  . Drug use: No    FAMILY HISTORY: Family History  Problem Relation Age of Onset  . Hypertension Father   . Diabetes Father        Type 2  . Hyperlipidemia Father   . Heart disease Paternal Grandmother   . Pancreatic cancer Paternal Grandfather     ROS: Review of Systems  Constitutional: Positive for malaise/Olivia. Negative for weight loss.       + Trouble sleeping  HENT: Positive for tinnitus.        + Hay fever  Eyes:       + Floaters  Respiratory: Positive for shortness of breath (with exertion).   Cardiovascular: Negative for chest pain and orthopnea.  Gastrointestinal: Positive for heartburn.  Musculoskeletal: Positive for back pain.       + Muscle or joint pain  Skin:       + Dryness  Endo/Heme/Allergies: Bruises/bleeds easily.  Psychiatric/Behavioral: The patient is nervous/anxious.        + Stress    PHYSICAL EXAM: Blood pressure 116/79, pulse 76, temperature 98.2 F (36.8 C), temperature source Oral, height 5\' 6"  (1.676 m), weight 200 lb (90.7 kg), last menstrual period 06/06/2018, SpO2 100 %. Body mass index is 32.28 kg/m. Physical Exam  Constitutional: She is oriented to person, place, and time. She appears well-developed and well-nourished.  HENT:  Head: Normocephalic and atraumatic.  Nose: Nose normal.  Eyes: EOM are normal. No scleral icterus.  Neck: Normal range of motion. Neck supple. No thyromegaly present.  Cardiovascular: Normal rate and regular rhythm.  Pulmonary/Chest: Effort normal. No respiratory distress.  Abdominal: Soft. There is no tenderness.  + Obesity  Musculoskeletal:  Range of Motion normal in all 4 extremities Trace edema noted in bilateral lower extremities  Neurological: She is alert and oriented to person, place, and time. Coordination normal.  Skin: Skin is warm  and dry.  Psychiatric: She has a normal Williams and affect. Her behavior is normal.  Vitals reviewed.   RECENT LABS AND TESTS: BMET    Component Value Date/Time   NA 139 06/16/2018 1238   K 4.4 06/16/2018 1238   CL 97 06/16/2018 1238   CO2 21 06/16/2018 1238   GLUCOSE 73 06/16/2018 1238   GLUCOSE 89 04/07/2018 0916   BUN 10 06/16/2018 1238   CREATININE 0.84 06/16/2018 1238   CREATININE 0.75 02/19/2017 1606   CALCIUM 9.5 06/16/2018 1238   GFRNONAA 90 06/16/2018 1238   GFRAA 103 06/16/2018 1238   Lab Results  Component Value Date   HGBA1C 5.4 06/16/2018   Lab Results  Component Value Date   INSULIN 11.9 06/16/2018   CBC    Component Value Date/Time   WBC 5.4 04/07/2018 0916   RBC 4.26 04/07/2018 0916   HGB 12.8 04/07/2018 0916   HCT 38.3 04/07/2018 0916   PLT 380.0 04/07/2018 0916   MCV 89.8 04/07/2018  0916   MCH 30.9 02/19/2017 1606   MCHC 33.4 04/07/2018 0916   RDW 14.2 04/07/2018 0916   LYMPHSABS 1.7 04/07/2018 0916   MONOABS 0.3 04/07/2018 0916   EOSABS 0.1 04/07/2018 0916   BASOSABS 0.0 04/07/2018 0916   Iron/TIBC/Ferritin/ %Sat No results found for: IRON, TIBC, FERRITIN, IRONPCTSAT Lipid Panel     Component Value Date/Time   CHOL 196 06/16/2018 1238   TRIG 139 06/16/2018 1238   HDL 64 06/16/2018 1238   CHOLHDL 3 04/07/2018 0916   VLDL 27.0 04/07/2018 0916   LDLCALC 104 (H) 06/16/2018 1238   Hepatic Function Panel     Component Value Date/Time   PROT 6.8 06/16/2018 1238   ALBUMIN 4.4 06/16/2018 1238   AST 44 (H) 06/16/2018 1238   ALT 68 (H) 06/16/2018 1238   ALKPHOS 74 06/16/2018 1238   BILITOT 0.5 06/16/2018 1238   BILIDIR 0.1 05/11/2012 1204      Component Value Date/Time   TSH 2.680 06/16/2018 1238   TSH 2.29 04/07/2018 0916   TSH 1.33 02/19/2017 1606    ECG  shows NSR with a rate of 88 BPM INDIRECT CALORIMETER done today shows a VO2 of 284 and a REE of 1976.  Her calculated basal metabolic rate is 6834 thus her basal metabolic rate is  better than expected.    ASSESSMENT AND PLAN: Other Olivia - Plan: EKG 12-Lead, Vitamin B12, CBC With Differential, Comprehensive metabolic panel, Folate, Hemoglobin A1c, Insulin, random, Lipid Panel With LDL/HDL Ratio, T3, T4, free, TSH, VITAMIN D 25 Hydroxy (Vit-D Deficiency, Fractures)  Shortness of breath on exertion - Plan: CBC With Differential  Depression screening  At risk for heart disease  Class 1 obesity with serious comorbidity and body mass index (BMI) of 32.0 to 32.9 in adult, unspecified obesity type  PLAN:  Olivia Talina was informed that her Olivia may be related to obesity, depression or many other causes. Labs will be ordered, and in the meanwhile Uzma has agreed to work on diet, exercise and weight loss to help with Olivia. Proper sleep hygiene was discussed including the need for 7-8 hours of quality sleep each night. A sleep study was not ordered based on symptoms and Epworth score.  Dyspnea on exertion [name]'s shortness of breath appears to be obesity related and exercise induced. She has agreed to work on weight loss and gradually increase exercise to treat her exercise induced shortness of breath. If [name] follows our instructions and loses weight without improvement of her shortness of breath, we will plan to refer to pulmonology. We will monitor this condition regularly. [name] agrees to this plan.  Depression Screen [name] had a moderately positive depression screening. Depression is commonly associated with obesity and often results in emotional eating behaviors. We will monitor this closely and work on CBT to help improve the non-hunger eating patterns. Referral to Psychology may be required if no improvement is seen as she continues in our clinic.  Cardiovascular risk counselling [name] was given extended (15 minutes) coronary artery disease prevention counseling today. She is 37 y.o. female and has risk factors for heart disease  including obesity. We discussed intensive lifestyle modifications today with an emphasis on specific weight loss instructions and strategies. Pt was also informed of the importance of increasing exercise and decreasing saturated fats to help prevent heart disease.  Obesity [name] is currently in the action stage of change and her goal is to continue with weight loss efforts. I recommend [name] begin the structured treatment  plan as follows:  She has agreed to follow the Category 3 plan [name] has been instructed to eventually work up to a goal of 150 minutes of combined cardio and strengthening exercise per week for weight loss and overall health benefits. We discussed the following Behavioral Modification Strategies today: increasing lean protein intake, decreasing simple carbohydrates  and work on meal planning and easy cooking plans   She was informed of the importance of frequent follow up visits to maximize her success with intensive lifestyle modifications for her multiple health conditions. She was informed we would discuss her lab results at her next visit unless there is a critical issue that needs to be addressed sooner. Keyasia agreed to keep her next visit at the agreed upon time to discuss these results.    OBESITY BEHAVIORAL INTERVENTION VISIT  Today's visit was # 1   Starting weight: 200 lbs Starting date: 06/16/18 Today's weight : 200 lbs Today's date: 06/16/2018 Total lbs lost to date: 0    ASK: We discussed the diagnosis of obesity with [name] today and [name] agreed to give [place] permission to discuss obesity behavioral modification therapy today.  ASSESS: Persis has the diagnosis of obesity and her BMI today is 32.3 Quadasia is in the action stage of change   ADVISE: Courtland was educated on the multiple health risks of obesity as well as the benefit of weight loss to improve her health. She was advised of the need for long term treatment and  the importance of lifestyle modifications to improve her current health and to decrease her risk of future health problems.  AGREE: Multiple dietary modification options and treatment options were discussed and  Kaidance agreed to follow the recommendations documented in the above note.  ARRANGE: Caylan was educated on the importance of frequent visits to treat obesity as outlined per CMS and USPSTF guidelines and agreed to schedule her next follow up appointment today.  I, Trixie Dredge, am acting as transcriptionist for Dennard Nip, MD  I have reviewed the above documentation for accuracy and completeness, and I agree with the above. -Dennard Nip, MD

## 2018-06-27 DIAGNOSIS — F4320 Adjustment disorder, unspecified: Secondary | ICD-10-CM | POA: Diagnosis not present

## 2018-06-30 ENCOUNTER — Ambulatory Visit (INDEPENDENT_AMBULATORY_CARE_PROVIDER_SITE_OTHER): Payer: BLUE CROSS/BLUE SHIELD | Admitting: Family Medicine

## 2018-06-30 VITALS — BP 107/73 | HR 83 | Temp 98.1°F | Ht 66.0 in | Wt 198.0 lb

## 2018-06-30 DIAGNOSIS — E559 Vitamin D deficiency, unspecified: Secondary | ICD-10-CM

## 2018-06-30 DIAGNOSIS — E8881 Metabolic syndrome: Secondary | ICD-10-CM

## 2018-06-30 DIAGNOSIS — Z6832 Body mass index (BMI) 32.0-32.9, adult: Secondary | ICD-10-CM

## 2018-06-30 DIAGNOSIS — Z9189 Other specified personal risk factors, not elsewhere classified: Secondary | ICD-10-CM | POA: Diagnosis not present

## 2018-06-30 DIAGNOSIS — E669 Obesity, unspecified: Secondary | ICD-10-CM | POA: Diagnosis not present

## 2018-06-30 MED ORDER — VITAMIN D (ERGOCALCIFEROL) 1.25 MG (50000 UNIT) PO CAPS: 50000.0000 [IU] | ORAL_CAPSULE | ORAL | 0 refills | Status: DC

## 2018-07-04 NOTE — Progress Notes (Signed)
Office: [phone]  /  Fax: [phone]   HPI:   Chief Complaint: OBESITY [name] is here to discuss her progress with her obesity treatment plan. She is on the Category 3 plan and is following her eating plan approximately 85 % of the time. She states she is exercising 0 minutes 0 times per week. [name] has done well with weight loss on her Category 3 plan. She states her hunger is controlled and she likes the structure of her food plan.  Her weight is 198 lb (89.8 kg) today and has had a weight loss of 2 pounds over a period of 2 weeks since her last visit. She has lost 2 lbs since starting treatment with [place].  Vitamin D Deficiency [name] has a diagnosis of vitamin D deficiency. She is on multivitamins, but level is not yet at goal. She denies nausea, vomiting or muscle weakness.  Insulin Resistance [name] has a new diagnosis of insulin resistance based on her elevated fasting insulin level >5. Her glucose and A1c are within normal limits, but insulin is elevated. She notes polyphagia but this has improved on diet. Although [name]'s blood glucose readings are still under good control, insulin resistance puts her at greater risk of metabolic syndrome and diabetes. She is not taking metformin currently and continues to work on diet and exercise to decrease risk of diabetes.  At risk for diabetes [name] is at higher than average risk for developing diabetes due to her obesity and insulin resistance. She currently denies polyuria or polydipsia.  ALLERGIES: Allergies  Allergen Reactions  . Sulfonamide Derivatives Swelling and Rash    MEDICATIONS: Current Outpatient Medications on File Prior to Visit  Medication Sig Dispense Refill  . escitalopram (LEXAPRO) 20 MG tablet TAKE 1 TABLET DAILY 180 tablet 0  . methylphenidate (CONCERTA) 54 MG PO CR tablet Take 1 tablet (54 mg total) by mouth every morning. 90 tablet 0  . norethindrone (MICRONOR,CAMILA,ERRIN) 0.35 MG tablet Take  1 tablet by mouth daily.     No current facility-administered medications on file prior to visit.     PAST MEDICAL HISTORY: Past Medical History:  Diagnosis Date  . ADHD   . Anemia   . Anxiety   . Anxiety and depression   . History of gestational hypertension   . Leukopenia   . Pre-eclampsia    2010    PAST SURGICAL HISTORY: Past Surgical History:  Procedure Laterality Date  . CESAREAN SECTION     2010  . EYE SURGERY  12/25/2016   Lasix [facility]    SOCIAL HISTORY: Social History   Tobacco Use  . Smoking status: Never Smoker  . Smokeless tobacco: Never Used  Substance Use Topics  . Alcohol use: No  . Drug use: No    FAMILY HISTORY: Family History  Problem Relation Age of Onset  . Hypertension Father   . Diabetes Father        Type 2  . Hyperlipidemia Father   . Heart disease Paternal Grandmother   . Pancreatic cancer Paternal Grandfather     ROS: Review of Systems  Constitutional: Positive for weight loss.  Gastrointestinal: Negative for nausea and vomiting.  Genitourinary: Negative for frequency.  Musculoskeletal:       Negative muscle weakness  Endo/Heme/Allergies: Negative for polydipsia.       Positive polyphagia    PHYSICAL EXAM: Blood pressure 107/73, pulse 83, temperature 98.1 F (36.7 C), temperature source Oral, height 5\' 6"  (1.676 m), weight 198 lb (89.8  kg), last menstrual period 06/06/2018, SpO2 98 %. Body mass index is 31.96 kg/m. Physical Exam  Constitutional: She is oriented to person, place, and time. She appears well-developed and well-nourished.  Cardiovascular: Normal rate.  Pulmonary/Chest: Effort normal.  Musculoskeletal: Normal range of motion.  Neurological: She is oriented to person, place, and time.  Skin: Skin is warm and dry.  Psychiatric: She has a normal mood and affect. Her behavior is normal.  Vitals reviewed.   RECENT LABS AND TESTS: BMET    Component Value Date/Time   NA 139 06/16/2018 1238   K 4.4  06/16/2018 1238   CL 97 06/16/2018 1238   CO2 21 06/16/2018 1238   GLUCOSE 73 06/16/2018 1238   GLUCOSE 89 04/07/2018 0916   BUN 10 06/16/2018 1238   CREATININE 0.84 06/16/2018 1238   CREATININE 0.75 02/19/2017 1606   CALCIUM 9.5 06/16/2018 1238   GFRNONAA 90 06/16/2018 1238   GFRAA 103 06/16/2018 1238   Lab Results  Component Value Date   HGBA1C 5.4 06/16/2018   Lab Results  Component Value Date   INSULIN 11.9 06/16/2018   CBC    Component Value Date/Time   WBC 5.4 04/07/2018 0916   RBC 4.26 04/07/2018 0916   HGB 12.8 04/07/2018 0916   HCT 38.3 04/07/2018 0916   PLT 380.0 04/07/2018 0916   MCV 89.8 04/07/2018 0916   MCH 30.9 02/19/2017 1606   MCHC 33.4 04/07/2018 0916   RDW 14.2 04/07/2018 0916   LYMPHSABS 1.7 04/07/2018 0916   MONOABS 0.3 04/07/2018 0916   EOSABS 0.1 04/07/2018 0916   BASOSABS 0.0 04/07/2018 0916   Iron/TIBC/Ferritin/ %Sat No results found for: IRON, TIBC, FERRITIN, IRONPCTSAT Lipid Panel     Component Value Date/Time   CHOL 196 06/16/2018 1238   TRIG 139 06/16/2018 1238   HDL 64 06/16/2018 1238   CHOLHDL 3 04/07/2018 0916   VLDL 27.0 04/07/2018 0916   LDLCALC 104 (H) 06/16/2018 1238   Hepatic Function Panel     Component Value Date/Time   PROT 6.8 06/16/2018 1238   ALBUMIN 4.4 06/16/2018 1238   AST 44 (H) 06/16/2018 1238   ALT 68 (H) 06/16/2018 1238   ALKPHOS 74 06/16/2018 1238   BILITOT 0.5 06/16/2018 1238   BILIDIR 0.1 05/11/2012 1204      Component Value Date/Time   TSH 2.680 06/16/2018 1238   TSH 2.29 04/07/2018 0916   TSH 1.33 02/19/2017 1606   Results for Cyr, NESA DISTEL (MRN [id number]) as of 07/04/2018 12:15  Ref. Range 06/16/2018 12:38  Vitamin D, 25-Hydroxy Latest Ref Range: 30.0 - 100.0 ng/mL 24.2 (L)   ASSESSMENT AND PLAN: Vitamin D deficiency - Plan: Vitamin D, Ergocalciferol, (DRISDOL) 50000 units CAPS capsule  Insulin resistance  At risk for diabetes mellitus  Class 1 obesity with serious comorbidity  and body mass index (BMI) of 32.0 to 32.9 in adult, unspecified obesity type  PLAN:  Vitamin D Deficiency [name] was informed that low vitamin D levels contributes to fatigue and are associated with obesity, breast, and colon cancer. [name] agrees to start prescription Vit D @50 ,000 IU every week #4 with no refills. She will follow up for routine testing of vitamin D, at least 2-3 times per year. She was informed of the risk of over-replacement of vitamin D and agrees to not increase her dose unless she discusses this with [place] first. We will recheck labs in 3 months. [name] agrees to follow up with our clinic in 2 to 3 weeks.  Insulin Resistance [name] will continue to work on weight loss, exercise, and decreasing simple carbohydrates in her diet to help decrease the risk of diabetes. We dicussed metformin including benefits and risks. She was informed that eating too many simple carbohydrates or too many calories at one sitting increases the likelihood of GI side effects. Melea declined metformin for now and prescription was not written today. We will recheck labs in 3 months. [name] agrees to follow up with our clinic in 2 to 3 weeks as directed to monitor her progress.  Diabetes risk counselling [name] was given extended (30 minutes) diabetes prevention counseling today. She is 37 y.o. female and has risk factors for diabetes including obesity and insulin resistance. We discussed intensive lifestyle modifications today with an emphasis on weight loss as well as increasing exercise and decreasing simple carbohydrates in her diet.  Obesity [name] is currently in the action stage of change. As such, her goal is to continue with weight loss efforts She has agreed to follow the Category 3 plan [name] has been instructed to work up to a goal of 150 minutes of combined cardio and strengthening exercise per week for weight loss and overall health benefits. Dinner recipes given. We  discussed the following Behavioral Modification Strategies today: increasing lean protein intake, decreasing simple carbohydrates  and work on meal planning and easy cooking plans   [name] has agreed to follow up with our clinic in 2 to 3 weeks. She was informed of the importance of frequent follow up visits to maximize her success with intensive lifestyle modifications for her multiple health conditions.   OBESITY BEHAVIORAL INTERVENTION VISIT  Today's visit was # 2   Starting weight: 200 lbs Starting date: 06/16/18 Today's weight : 198 lbs  Today's date: 06/30/2018 Total lbs lost to date: 2    ASK: We discussed the diagnosis of obesity with [name] today and [name] agreed to give [place] permission to discuss obesity behavioral modification therapy today.  ASSESS: [name] has the diagnosis of obesity and her BMI today is 31.97 [name] is in the action stage of change   ADVISE: [name] was educated on the multiple health risks of obesity as well as the benefit of weight loss to improve her health. She was advised of the need for long term treatment and the importance of lifestyle modifications to improve her current health and to decrease her risk of future health problems.  AGREE: Multiple dietary modification options and treatment options were discussed and  [name] agreed to follow the recommendations documented in the above note.  ARRANGE: [name] was educated on the importance of frequent visits to treat obesity as outlined per CMS and USPSTF guidelines and agreed to schedule her next follow up appointment today.  I, Trixie Dredge, am acting as transcriptionist for Dennard Nip, MD  I have reviewed the above documentation for accuracy and completeness, and I agree with the above. -Dennard Nip, MD

## 2018-07-11 DIAGNOSIS — F4320 Adjustment disorder, unspecified: Secondary | ICD-10-CM | POA: Diagnosis not present

## 2018-07-12 ENCOUNTER — Encounter: Payer: Self-pay | Admitting: Neurology

## 2018-07-12 ENCOUNTER — Ambulatory Visit (INDEPENDENT_AMBULATORY_CARE_PROVIDER_SITE_OTHER): Payer: BLUE CROSS/BLUE SHIELD | Admitting: Neurology

## 2018-07-12 VITALS — BP 122/78 | HR 100 | Ht 66.0 in | Wt 204.0 lb

## 2018-07-12 DIAGNOSIS — E559 Vitamin D deficiency, unspecified: Secondary | ICD-10-CM

## 2018-07-12 DIAGNOSIS — G43709 Chronic migraine without aura, not intractable, without status migrainosus: Secondary | ICD-10-CM | POA: Diagnosis not present

## 2018-07-12 DIAGNOSIS — IMO0002 Reserved for concepts with insufficient information to code with codable children: Secondary | ICD-10-CM

## 2018-07-12 MED ORDER — SUMATRIPTAN SUCCINATE 50 MG PO TABS: 50.0000 mg | ORAL_TABLET | ORAL | 11 refills | Status: DC | PRN

## 2018-07-12 NOTE — Progress Notes (Signed)
PATIENT: [name] DOB: March 16, 1981  Chief Complaint  Patient presents with  . New Patient (Initial Visit)    PCP: Dr. Etter Sjogren. Referring: Dr. Julien Girt. New room, alone. Vision: 20/30 without correction.  . Migraine    Pt gets 1-2 migraines per month that last 4 days. Pt takes sumatriptan which helps.     HISTORICAL  [name] is a 37 year old female, seen in request by her primary care physician Dr.Lowne Lyndal Pulley R for evaluation of chronic migraine headache, initial evaluation was on July 12, 2018.  I have reviewed and summarized the referring note from the referring physician.  She reported a history of migraine headaches since 2018, her typical migraine of vertex region bandlike pressure headache, with associated light noise sensitivity, mild nausea, it can happen once or twice each month, used to last 3 to 4 days, not responding to over-the-counter medication treatment such as Excedrin Migraine,  Sometimes she has migraine with auras, proceeding by flashing light in her visual field.,  she was given Imitrex 50 mg as needed which works very well for her migraine headaches,  She has been treated with low-dose combination estrogen progesterone contraceptives for many years, recently was changed to progesterone only contraceptives, tolerating it well,   REVIEW OF SYSTEMS: Full 14 system review of systems performed and notable only for fatigue, ringing in ears, easy bruising, joint pain, achy muscles, allergy, headache, anxiety too much sleep, decreased energy All other review of systems were negative.  ALLERGIES: Allergies  Allergen Reactions  . Sulfonamide Derivatives Swelling and Rash    HOME MEDICATIONS: Current Outpatient Medications  Medication Sig Dispense Refill  . escitalopram (LEXAPRO) 20 MG tablet TAKE 1 TABLET DAILY 180 tablet 0  . methylphenidate (CONCERTA) 54 MG PO CR tablet Take 1 tablet (54 mg total) by mouth every morning. 90 tablet 0    . norethindrone (MICRONOR,CAMILA,ERRIN) 0.35 MG tablet Take 1 tablet by mouth daily.    . SUMAtriptan (IMITREX) 50 MG tablet Take 50 mg by mouth.    . Vitamin D, Ergocalciferol, (DRISDOL) 50000 units CAPS capsule Take 1 capsule (50,000 Units total) by mouth every 7 (seven) days. 4 capsule 0   No current facility-administered medications for this visit.     PAST MEDICAL HISTORY: Past Medical History:  Diagnosis Date  . ADHD   . Anemia   . Anxiety   . Anxiety and depression   . History of gestational hypertension   . Leukopenia   . Pre-eclampsia    2010    PAST SURGICAL HISTORY: Past Surgical History:  Procedure Laterality Date  . CESAREAN SECTION     2010  . EYE SURGERY  12/25/2016   Lasix Eye Surgery    FAMILY HISTORY: Family History  Problem Relation Age of Onset  . Hypertension Father   . Diabetes Father        Type 2  . Hyperlipidemia Father   . Heart disease Paternal Grandmother   . Pancreatic cancer Paternal Grandfather     SOCIAL HISTORY: Social History   Socioeconomic History  . Marital status: Married    Spouse name: Goddess Gebbia  . Number of children: 1  . Years of education: Not on file  . Highest education level: Not on file  Occupational History  . 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 Financial resource strain: Not on file  . Food insecurity:    Worry: Not on file  Inability: Not on file  . Transportation needs:    Medical: Not on file    Non-medical: Not on file  Tobacco Use  . Smoking status: Never Smoker  . Smokeless tobacco: Never Used  Substance and Sexual Activity  . Alcohol use: No  . Drug use: No  . Sexual activity: Yes    Partners: Male  Lifestyle  . Physical activity:    Days per week: Not on file    Minutes per session: Not on file  . Stress: Not on file  Relationships  . Social connections:    Talks on phone: Not on file    Gets together: Not on file    Attends  religious service: Not on file    Active member of club or organization: Not on file    Attends meetings of clubs or organizations: Not on file    Relationship status: Not on file  . Intimate partner violence:    Fear of current or ex partner: Not on file    Emotionally abused: Not on file    Physically abused: Not on file    Forced sexual activity: Not on file  Other Topics Concern  . Not on file  Social History Narrative  . Not on file     PHYSICAL EXAM   Vitals:   07/12/18 1329  BP: 122/78  Pulse: 100  Weight: 204 lb (92.5 kg)  Height: 5\' 6"  (1.676 m)    Not recorded      Body mass index is 32.93 kg/m.  PHYSICAL EXAMNIATION:  Gen: NAD, conversant, well nourised, obese, well groomed                     Cardiovascular: Regular rate rhythm, no peripheral edema, warm, nontender. Eyes: Conjunctivae clear without exudates or hemorrhage Neck: Supple, no carotid bruits. Pulmonary: Clear to auscultation bilaterally   NEUROLOGICAL EXAM:  MENTAL STATUS: Speech:    Speech is normal; fluent and spontaneous with normal comprehension.  Cognition:     Orientation to time, place and person     Normal recent and remote memory     Normal Attention span and concentration     Normal Language, naming, repeating,spontaneous speech     Fund of knowledge   CRANIAL NERVES: CN II: Visual fields are full to confrontation. Fundoscopic exam is normal with sharp discs and no vascular changes. Pupils are round equal and briskly reactive to light. CN III, IV, VI: extraocular movement are normal. No ptosis. CN V: Facial sensation is intact to pinprick in all 3 divisions bilaterally. Corneal responses are intact.  CN VII: Face is symmetric with normal eye closure and smile. CN VIII: Hearing is normal to rubbing fingers CN IX, X: Palate elevates symmetrically. Phonation is normal. CN XI: Head turning and shoulder shrug are intact CN XII: Tongue is midline with normal movements and no  atrophy.  MOTOR: There is no pronator drift of out-stretched arms. Muscle bulk and tone are normal. Muscle strength is normal.  REFLEXES: Reflexes are 2+ and symmetric at the biceps, triceps, knees, and ankles. Plantar responses are flexor.  SENSORY: Intact to light touch, pinprick, positional sensation and vibratory sensation are intact in fingers and toes.  COORDINATION: Rapid alternating movements and fine finger movements are intact. There is no dysmetria on finger-to-nose and heel-knee-shin.    GAIT/STANCE: Posture is normal. Gait is steady with normal steps, base, arm swing, and turning. Heel and toe walking are normal. Tandem gait is normal.  Romberg is absent.   DIAGNOSTIC DATA (LABS, IMAGING, TESTING) - I reviewed patient records, labs, notes, testing and imaging myself where available.   ASSESSMENT AND PLAN  [name] is a 37 y.o. female    Chronic migraine headaches  Imitrex as needed  May combine it together with Aleve   Marcial Pacas, M.D. Ph.D.  [facility] [address], [place], [place] [address] Ph: [phone] Fax: [phone]  CC: Carollee Herter, Alferd Apa, DO

## 2018-07-20 ENCOUNTER — Ambulatory Visit (INDEPENDENT_AMBULATORY_CARE_PROVIDER_SITE_OTHER): Payer: BLUE CROSS/BLUE SHIELD | Admitting: Family Medicine

## 2018-07-20 VITALS — BP 111/83 | HR 65 | Temp 97.7°F | Ht 66.0 in | Wt 199.0 lb

## 2018-07-20 DIAGNOSIS — E559 Vitamin D deficiency, unspecified: Secondary | ICD-10-CM

## 2018-07-20 DIAGNOSIS — Z9189 Other specified personal risk factors, not elsewhere classified: Secondary | ICD-10-CM

## 2018-07-20 DIAGNOSIS — E669 Obesity, unspecified: Secondary | ICD-10-CM

## 2018-07-20 DIAGNOSIS — Z6832 Body mass index (BMI) 32.0-32.9, adult: Secondary | ICD-10-CM

## 2018-07-20 MED ORDER — VITAMIN D (ERGOCALCIFEROL) 1.25 MG (50000 UNIT) PO CAPS: 50000.0000 [IU] | ORAL_CAPSULE | ORAL | 0 refills | Status: DC

## 2018-07-23 ENCOUNTER — Other Ambulatory Visit (INDEPENDENT_AMBULATORY_CARE_PROVIDER_SITE_OTHER): Payer: Self-pay | Admitting: Family Medicine

## 2018-07-23 DIAGNOSIS — E559 Vitamin D deficiency, unspecified: Secondary | ICD-10-CM

## 2018-07-23 NOTE — Progress Notes (Signed)
Office: [phone]  /  Fax: [phone]   HPI:   Chief Complaint: OBESITY [name] is here to discuss her progress with her obesity treatment plan. She is on the Category 3 plan and is following her eating plan approximately 70 % of the time. She states she is exercising 0 minutes 0 times per week. [name] had increased celebration eating in the last week but is ready to get back on track. She is getting bored with breakfast and would like additional options.  Her weight is 199 lb (90.3 kg) today and has gained 1 pound since her last visit. She has lost 1 lb since starting treatment with [place].  Vitamin D Deficiency [name] has a diagnosis of vitamin D deficiency. She is stable on prescription Vit D, but level is not yet at goal. She denies nausea, vomiting or muscle weakness.  At risk for osteopenia and osteoporosis [name] is at higher risk of osteopenia and osteoporosis due to vitamin D deficiency.   ALLERGIES: Allergies  Allergen Reactions  . Sulfonamide Derivatives Swelling and Rash    MEDICATIONS: Current Outpatient Medications on File Prior to Visit  Medication Sig Dispense Refill  . escitalopram (LEXAPRO) 20 MG tablet TAKE 1 TABLET DAILY 180 tablet 0  . methylphenidate (CONCERTA) 54 MG PO CR tablet Take 1 tablet (54 mg total) by mouth every morning. 90 tablet 0  . norethindrone (MICRONOR,CAMILA,ERRIN) 0.35 MG tablet Take 1 tablet by mouth daily.    . SUMAtriptan (IMITREX) 50 MG tablet Take 1 tablet (50 mg total) by mouth as needed for migraine (may repeat once in 2 hours, no more than 2 tabs in 24 hours). 12 tablet 11   No current facility-administered medications on file prior to visit.     PAST MEDICAL HISTORY: Past Medical History:  Diagnosis Date  . ADHD   . Anemia   . Anxiety   . Anxiety and depression   . History of gestational hypertension   . Leukopenia   . Pre-eclampsia    2010    PAST SURGICAL HISTORY: Past Surgical History:  Procedure  Laterality Date  . CESAREAN SECTION     2010  . EYE SURGERY  12/25/2016   [facility]    SOCIAL HISTORY: Social History   Tobacco Use  . Smoking status: Never Smoker  . Smokeless tobacco: Never Used  Substance Use Topics  . Alcohol use: No  . Drug use: No    FAMILY HISTORY: Family History  Problem Relation Age of Onset  . Hypertension Father   . Diabetes Father        Type 2  . Hyperlipidemia Father   . Heart disease Paternal Grandmother   . Pancreatic cancer Paternal Grandfather     ROS: Review of Systems  Constitutional: Negative for weight loss.  Gastrointestinal: Negative for nausea and vomiting.  Musculoskeletal:       Negative muscle weakness    PHYSICAL EXAM: Blood pressure 111/83, pulse 65, temperature 97.7 F (36.5 C), temperature source Oral, height 5\' 6"  (1.676 m), weight 199 lb (90.3 kg), SpO2 100 %. Body mass index is 32.12 kg/m. Physical Exam  Constitutional: She is oriented to person, place, and time. She appears well-developed and well-nourished.  Cardiovascular: Normal rate.  Pulmonary/Chest: Effort normal.  Musculoskeletal: Normal range of motion.  Neurological: She is oriented to person, place, and time.  Skin: Skin is warm and dry.  Psychiatric: She has a normal mood and affect. Her behavior is normal.  Vitals reviewed.  RECENT LABS AND TESTS: BMET    Component Value Date/Time   NA 139 06/16/2018 1238   K 4.4 06/16/2018 1238   CL 97 06/16/2018 1238   CO2 21 06/16/2018 1238   GLUCOSE 73 06/16/2018 1238   GLUCOSE 89 04/07/2018 0916   BUN 10 06/16/2018 1238   CREATININE 0.84 06/16/2018 1238   CREATININE 0.75 02/19/2017 1606   CALCIUM 9.5 06/16/2018 1238   GFRNONAA 90 06/16/2018 1238   GFRAA 103 06/16/2018 1238   Lab Results  Component Value Date   HGBA1C 5.4 06/16/2018   Lab Results  Component Value Date   INSULIN 11.9 06/16/2018   CBC    Component Value Date/Time   WBC 5.4 04/07/2018 0916   RBC 4.26 04/07/2018  0916   HGB 12.8 04/07/2018 0916   HCT 38.3 04/07/2018 0916   PLT 380.0 04/07/2018 0916   MCV 89.8 04/07/2018 0916   MCH 30.9 02/19/2017 1606   MCHC 33.4 04/07/2018 0916   RDW 14.2 04/07/2018 0916   LYMPHSABS 1.7 04/07/2018 0916   MONOABS 0.3 04/07/2018 0916   EOSABS 0.1 04/07/2018 0916   BASOSABS 0.0 04/07/2018 0916   Iron/TIBC/Ferritin/ %Sat No results found for: IRON, TIBC, FERRITIN, IRONPCTSAT Lipid Panel     Component Value Date/Time   CHOL 196 06/16/2018 1238   TRIG 139 06/16/2018 1238   HDL 64 06/16/2018 1238   CHOLHDL 3 04/07/2018 0916   VLDL 27.0 04/07/2018 0916   LDLCALC 104 (H) 06/16/2018 1238   Hepatic Function Panel     Component Value Date/Time   PROT 6.8 06/16/2018 1238   ALBUMIN 4.4 06/16/2018 1238   AST 44 (H) 06/16/2018 1238   ALT 68 (H) 06/16/2018 1238   ALKPHOS 74 06/16/2018 1238   BILITOT 0.5 06/16/2018 1238   BILIDIR 0.1 05/11/2012 1204      Component Value Date/Time   TSH 2.680 06/16/2018 1238   TSH 2.29 04/07/2018 0916   TSH 1.33 02/19/2017 1606  Results for Langlois, ZANAI MALLARI (MRN [id number]) as of 07/23/2018 16:53  Ref. Range 06/16/2018 12:38  Vitamin D, 25-Hydroxy Latest Ref Range: 30.0 - 100.0 ng/mL 24.2 (L)    ASSESSMENT AND PLAN: Vitamin D deficiency - Plan: Vitamin D, Ergocalciferol, (DRISDOL) 50000 units CAPS capsule  At risk for osteoporosis  Class 1 obesity with serious comorbidity and body mass index (BMI) of 32.0 to 32.9 in adult, unspecified obesity type  PLAN:  Vitamin D Deficiency [name] was informed that low vitamin D levels contributes to fatigue and are associated with obesity, breast, and colon cancer. Aella agrees to continue taking prescription Vit D @50 ,000 IU every week #4 and we will refill for 1 month. She will follow up for routine testing of vitamin D, at least 2-3 times per year. She was informed of the risk of over-replacement of vitamin D and agrees to not increase her dose unless she discusses this  with [place] first. [name] agrees to follow up with our clinic in 2 to 3 weeks.  At risk for osteopenia and osteoporosis [name] was given extended (15 minutes) osteoporosis prevention counseling today. [name] is at risk for osteopenia and osteoporsis due to her vitamin D deficiency. She was encouraged to take her vitamin D and follow her higher calcium diet and increase strengthening exercise to help strengthen her bones and decrease her risk of osteopenia and osteoporosis.  Obesity [name] is currently in the action stage of change. As such, her goal is to continue with weight loss efforts She has agreed  to follow the Category 3 plan with breakfast options [name] has been instructed to work up to a goal of 150 minutes of combined cardio and strengthening exercise per week for weight loss and overall health benefits. We discussed the following Behavioral Modification Strategies today: increasing lean protein intake, decreasing simple carbohydrates, work on meal planning and easy cooking plans, and celebration eating strategies    [name] has agreed to follow up with our clinic in 2 to 3 weeks. She was informed of the importance of frequent follow up visits to maximize her success with intensive lifestyle modifications for her multiple health conditions.   OBESITY BEHAVIORAL INTERVENTION VISIT  Today's visit was # 3   Starting weight: 200 lbs Starting date: 06/16/18 Today's weight : 199 lbs  Today's date: 07/20/2018 Total lbs lost to date: 1    ASK: We discussed the diagnosis of obesity with [name] today and [name] agreed to give [place] permission to discuss obesity behavioral modification therapy today.  ASSESS: [name] has the diagnosis of obesity and her BMI today is 32.13 [name] is in the action stage of change   ADVISE: [name] was educated on the multiple health risks of obesity as well as the benefit of weight loss to improve her health. She was advised  of the need for long term treatment and the importance of lifestyle modifications to improve her current health and to decrease her risk of future health problems.  AGREE: Multiple dietary modification options and treatment options were discussed and  [name] agreed to follow the recommendations documented in the above note.  ARRANGE: Reeda was educated on the importance of frequent visits to treat obesity as outlined per CMS and USPSTF guidelines and agreed to schedule her next follow up appointment today.  I, Trixie Dredge, am acting as transcriptionist for Dennard Nip, MD  I have reviewed the above documentation for accuracy and completeness, and I agree with the above. -Dennard Nip, MD

## 2018-07-26 ENCOUNTER — Encounter (INDEPENDENT_AMBULATORY_CARE_PROVIDER_SITE_OTHER): Payer: Self-pay | Admitting: Family Medicine

## 2018-08-05 ENCOUNTER — Other Ambulatory Visit: Payer: Self-pay | Admitting: Family Medicine

## 2018-08-10 ENCOUNTER — Ambulatory Visit (INDEPENDENT_AMBULATORY_CARE_PROVIDER_SITE_OTHER): Payer: BLUE CROSS/BLUE SHIELD | Admitting: Family Medicine

## 2018-08-10 VITALS — BP 112/76 | HR 79 | Ht 66.0 in | Wt 197.0 lb

## 2018-08-10 DIAGNOSIS — E669 Obesity, unspecified: Secondary | ICD-10-CM | POA: Diagnosis not present

## 2018-08-10 DIAGNOSIS — E559 Vitamin D deficiency, unspecified: Secondary | ICD-10-CM | POA: Diagnosis not present

## 2018-08-10 DIAGNOSIS — Z6831 Body mass index (BMI) 31.0-31.9, adult: Secondary | ICD-10-CM

## 2018-08-10 MED ORDER — VITAMIN D (ERGOCALCIFEROL) 1.25 MG (50000 UNIT) PO CAPS: 50000.0000 [IU] | ORAL_CAPSULE | ORAL | 0 refills | Status: DC

## 2018-08-11 NOTE — Progress Notes (Signed)
Office: [phone]  /  Fax: [phone]   HPI:   Chief Complaint: OBESITY [name] is here to discuss [name] progress with [name] obesity treatment plan. She is on the Category 3 plan with breakfast options and is following [name] eating plan approximately 50 % of the time. She states she is exercising 0 minutes 0 times per week. [name] continues to lose weight, but she is still struggling with meal planning especially with family and going to school.  [name] weight is 197 lb (89.4 kg) today and has had a weight loss of 2 pounds over a period of 3 weeks since [name] last visit. She has lost 3 lbs since starting treatment with [name].  Vitamin D Deficiency [name] has a diagnosis of vitamin D deficiency. She is stable on prescription Vit D, but level is not yet at goal. She denies nausea, vomiting or muscle weakness.  ALLERGIES: Allergies  Allergen Reactions  . Sulfonamide Derivatives Swelling and Rash    MEDICATIONS: Current Outpatient Medications on File Prior to Visit  Medication Sig Dispense Refill  . escitalopram (LEXAPRO) 20 MG tablet TAKE 1 TABLET DAILY 180 tablet 1  . methylphenidate (CONCERTA) 54 MG PO CR tablet Take 1 tablet (54 mg total) by mouth every morning. 90 tablet 0  . norethindrone (MICRONOR,CAMILA,ERRIN) 0.35 MG tablet Take 1 tablet by mouth daily.    . SUMAtriptan (IMITREX) 50 MG tablet Take 1 tablet (50 mg total) by mouth as needed for migraine (may repeat once in 2 hours, no more than 2 tabs in 24 hours). 12 tablet 11   No current facility-administered medications on file prior to visit.     PAST MEDICAL HISTORY: Past Medical History:  Diagnosis Date  . ADHD   . Anemia   . Anxiety   . Anxiety and depression   . History of gestational hypertension   . Leukopenia   . Pre-eclampsia    2010    PAST SURGICAL HISTORY: Past Surgical History:  Procedure Laterality Date  . CESAREAN SECTION     2010  . EYE SURGERY  12/25/2016   Lasix [facility] Surgery    SOCIAL  HISTORY: Social History   Tobacco Use  . Smoking status: Never Smoker  . Smokeless tobacco: Never Used  Substance Use Topics  . Alcohol use: No  . Drug use: No    FAMILY HISTORY: Family History  Problem Relation Age of Onset  . Hypertension Father   . Diabetes Father        Type 2  . Hyperlipidemia Father   . Heart disease Paternal Grandmother   . Pancreatic cancer Paternal Grandfather     ROS: Review of Systems  Constitutional: Positive for weight loss.  Gastrointestinal: Negative for nausea and vomiting.  Musculoskeletal:       Negative muscle weakness    PHYSICAL EXAM: Blood pressure 112/76, pulse 79, height 5\' 6"  (1.676 m), weight 197 lb (89.4 kg), SpO2 99 %. Body mass index is 31.8 kg/m. Physical Exam  Constitutional: She is oriented to person, place, and time. She appears well-developed and well-nourished.  Cardiovascular: Normal rate.  Pulmonary/Chest: Effort normal.  Musculoskeletal: Normal range of motion.  Neurological: She is oriented to person, place, and time.  Skin: Skin is warm and dry.  Psychiatric: She has a normal mood and affect. [name] behavior is normal.  Vitals reviewed.   RECENT LABS AND TESTS: BMET    Component Value Date/Time   NA 139 06/16/2018 1238   K 4.4 06/16/2018 1238  CL 97 06/16/2018 1238   CO2 21 06/16/2018 1238   GLUCOSE 73 06/16/2018 1238   GLUCOSE 89 04/07/2018 0916   BUN 10 06/16/2018 1238   CREATININE 0.84 06/16/2018 1238   CREATININE 0.75 02/19/2017 1606   CALCIUM 9.5 06/16/2018 1238   GFRNONAA 90 06/16/2018 1238   GFRAA 103 06/16/2018 1238   Lab Results  Component Value Date   HGBA1C 5.4 06/16/2018   Lab Results  Component Value Date   INSULIN 11.9 06/16/2018   CBC    Component Value Date/Time   WBC 5.4 04/07/2018 0916   RBC 4.26 04/07/2018 0916   HGB 12.8 04/07/2018 0916   HCT 38.3 04/07/2018 0916   PLT 380.0 04/07/2018 0916   MCV 89.8 04/07/2018 0916   MCH 30.9 02/19/2017 1606   MCHC 33.4  04/07/2018 0916   RDW 14.2 04/07/2018 0916   LYMPHSABS 1.7 04/07/2018 0916   MONOABS 0.3 04/07/2018 0916   EOSABS 0.1 04/07/2018 0916   BASOSABS 0.0 04/07/2018 0916   Iron/TIBC/Ferritin/ %Sat No results found for: IRON, TIBC, FERRITIN, IRONPCTSAT Lipid Panel     Component Value Date/Time   CHOL 196 06/16/2018 1238   TRIG 139 06/16/2018 1238   HDL 64 06/16/2018 1238   CHOLHDL 3 04/07/2018 0916   VLDL 27.0 04/07/2018 0916   LDLCALC 104 (H) 06/16/2018 1238   Hepatic Function Panel     Component Value Date/Time   PROT 6.8 06/16/2018 1238   ALBUMIN 4.4 06/16/2018 1238   AST 44 (H) 06/16/2018 1238   ALT 68 (H) 06/16/2018 1238   ALKPHOS 74 06/16/2018 1238   BILITOT 0.5 06/16/2018 1238   BILIDIR 0.1 05/11/2012 1204      Component Value Date/Time   TSH 2.680 06/16/2018 1238   TSH 2.29 04/07/2018 0916   TSH 1.33 02/19/2017 1606  Results for Radi, LATREECE MOCHIZUKI (MRN [id number]) as of 08/11/2018 15:44  Ref. Range 06/16/2018 12:38  Vitamin D, 25-Hydroxy Latest Ref Range: 30.0 - 100.0 ng/mL 24.2 (L)    ASSESSMENT AND PLAN: Vitamin D deficiency - Plan: Vitamin D, Ergocalciferol, (DRISDOL) 1.25 MG (50000 UT) CAPS capsule  Class 1 obesity with serious comorbidity and body mass index (BMI) of 31.0 to 31.9 in adult, unspecified obesity type  PLAN:  Vitamin D Deficiency [name] was informed that low vitamin D levels contributes to fatigue and are associated with obesity, breast, and colon cancer. [name] agrees to continue taking prescription Vit D @50 ,000 IU every week #4 and we will refill for 1 month. She will follow up for routine testing of vitamin D, at least 2-3 times per year. She was informed of the risk of over-replacement of vitamin D and agrees to not increase [name] dose unless she discusses this with [name] first. [name] agrees to follow up with our clinic in 3 weeks.  Obesity [name] is currently in the action stage of change. As such, [name] goal is to continue with weight  loss efforts She has agreed to keep a food journal with 400-550 calories and 40 grams of protein at supper daily and follow the Category 3 plan [name] has been instructed to work up to a goal of 150 minutes of combined cardio and strengthening exercise per week for weight loss and overall health benefits. We discussed the following Behavioral Modification Strategies today: increasing lean protein intake, decreasing simple carbohydrates, work on meal planning and easy cooking plans, holiday eating strategies, and keep a strict food journal    [name] has agreed to follow up with  our clinic in 3 weeks. She was informed of the importance of frequent follow up visits to maximize [name] success with intensive lifestyle modifications for [name] multiple health conditions.   OBESITY BEHAVIORAL INTERVENTION VISIT  Today's visit was # 4   Starting weight: 200 lbs Starting date: 06/16/18 Today's weight : 197 lbs Today's date: 08/10/2018 Total lbs lost to date: 3    ASK: We discussed the diagnosis of obesity with [name] today and [name] agreed to give [name] permission to discuss obesity behavioral modification therapy today.  ASSESS: [name] has the diagnosis of obesity and [name] BMI today is 31.81 [name] is in the action stage of change   ADVISE: [name] was educated on the multiple health risks of obesity as well as the benefit of weight loss to improve [name] health. She was advised of the need for long term treatment and the importance of lifestyle modifications to improve [name] current health and to decrease [name] risk of future health problems.  AGREE: Multiple dietary modification options and treatment options were discussed and  [name] agreed to follow the recommendations documented in the above note.  ARRANGE: [name] was educated on the importance of frequent visits to treat obesity as outlined per CMS and USPSTF guidelines and agreed to schedule [name] next follow up  appointment today.  I, [name], am acting as transcriptionist for Dennard Nip, MD  I have reviewed the above documentation for accuracy and completeness, and I agree with the above. -Dennard Nip, MD

## 2018-08-29 DIAGNOSIS — M9905 Segmental and somatic dysfunction of pelvic region: Secondary | ICD-10-CM | POA: Diagnosis not present

## 2018-08-29 DIAGNOSIS — M9903 Segmental and somatic dysfunction of lumbar region: Secondary | ICD-10-CM | POA: Diagnosis not present

## 2018-08-29 DIAGNOSIS — M9902 Segmental and somatic dysfunction of thoracic region: Secondary | ICD-10-CM | POA: Diagnosis not present

## 2018-08-29 DIAGNOSIS — M545 Low back pain: Secondary | ICD-10-CM | POA: Diagnosis not present

## 2018-08-31 DIAGNOSIS — M9903 Segmental and somatic dysfunction of lumbar region: Secondary | ICD-10-CM | POA: Diagnosis not present

## 2018-08-31 DIAGNOSIS — M9902 Segmental and somatic dysfunction of thoracic region: Secondary | ICD-10-CM | POA: Diagnosis not present

## 2018-08-31 DIAGNOSIS — M9905 Segmental and somatic dysfunction of pelvic region: Secondary | ICD-10-CM | POA: Diagnosis not present

## 2018-08-31 DIAGNOSIS — M545 Low back pain: Secondary | ICD-10-CM | POA: Diagnosis not present

## 2018-09-01 ENCOUNTER — Encounter (INDEPENDENT_AMBULATORY_CARE_PROVIDER_SITE_OTHER): Payer: Self-pay

## 2018-09-01 ENCOUNTER — Ambulatory Visit (INDEPENDENT_AMBULATORY_CARE_PROVIDER_SITE_OTHER): Payer: BLUE CROSS/BLUE SHIELD | Admitting: Physician Assistant

## 2018-09-05 DIAGNOSIS — M9905 Segmental and somatic dysfunction of pelvic region: Secondary | ICD-10-CM | POA: Diagnosis not present

## 2018-09-05 DIAGNOSIS — M545 Low back pain: Secondary | ICD-10-CM | POA: Diagnosis not present

## 2018-09-05 DIAGNOSIS — M9902 Segmental and somatic dysfunction of thoracic region: Secondary | ICD-10-CM | POA: Diagnosis not present

## 2018-09-05 DIAGNOSIS — M9903 Segmental and somatic dysfunction of lumbar region: Secondary | ICD-10-CM | POA: Diagnosis not present

## 2018-09-08 ENCOUNTER — Ambulatory Visit (INDEPENDENT_AMBULATORY_CARE_PROVIDER_SITE_OTHER): Payer: BLUE CROSS/BLUE SHIELD | Admitting: Physician Assistant

## 2018-09-08 ENCOUNTER — Encounter (INDEPENDENT_AMBULATORY_CARE_PROVIDER_SITE_OTHER): Payer: Self-pay | Admitting: Physician Assistant

## 2018-09-08 VITALS — BP 119/78 | HR 71 | Temp 97.8°F | Ht 66.0 in | Wt 195.0 lb

## 2018-09-08 DIAGNOSIS — Z6831 Body mass index (BMI) 31.0-31.9, adult: Secondary | ICD-10-CM

## 2018-09-08 DIAGNOSIS — Z9189 Other specified personal risk factors, not elsewhere classified: Secondary | ICD-10-CM | POA: Diagnosis not present

## 2018-09-08 DIAGNOSIS — E8881 Metabolic syndrome: Secondary | ICD-10-CM

## 2018-09-08 DIAGNOSIS — E559 Vitamin D deficiency, unspecified: Secondary | ICD-10-CM

## 2018-09-08 DIAGNOSIS — E669 Obesity, unspecified: Secondary | ICD-10-CM | POA: Diagnosis not present

## 2018-09-08 MED ORDER — VITAMIN D (ERGOCALCIFEROL) 1.25 MG (50000 UNIT) PO CAPS: 50000.0000 [IU] | ORAL_CAPSULE | ORAL | 0 refills | Status: DC

## 2018-09-08 NOTE — Progress Notes (Signed)
Office: [phone]  /  Fax: [phone]   HPI:   Chief Complaint: OBESITY [name] is here to discuss her progress with her obesity treatment plan. She is on the Category 3 plan and is following her eating plan approximately 75 % of the time. She states she is exercising 0 minutes 0 times per week. [name] did well with weight loss. She reports that she has been eating sweet potatoes at times, at night, not realizing that it is not on the plan. Her weight is 195 lb (88.5 kg) today and has had a weight loss of 2 pounds over a period of 3 weeks since her last visit. She has lost 5 lbs since starting treatment with [place].  Vitamin D deficiency [name] has a diagnosis of vitamin D deficiency. She is currently taking vit D and denies nausea, vomiting or muscle weakness.  At risk for osteopenia and osteoporosis [name] is at higher risk of osteopenia and osteoporosis due to vitamin D deficiency.   Insulin Resistance [name] has a diagnosis of insulin resistance based on her elevated fasting insulin level >5. Although [name]'s blood glucose readings are still under good control, insulin resistance puts her at greater risk of metabolic syndrome and diabetes. She is not taking metformin currently and continues to work on diet and exercise to decrease risk of diabetes. She denies polyphagia.     ALLERGIES: Allergies  Allergen Reactions  . Sulfonamide Derivatives Swelling and Rash    MEDICATIONS: Current Outpatient Medications on File Prior to Visit  Medication Sig Dispense Refill  . escitalopram (LEXAPRO) 20 MG tablet TAKE 1 TABLET DAILY 180 tablet 1  . methylphenidate (CONCERTA) 54 MG PO CR tablet Take 1 tablet (54 mg total) by mouth every morning. 90 tablet 0  . norethindrone (MICRONOR,CAMILA,ERRIN) 0.35 MG tablet Take 1 tablet by mouth daily.    . SUMAtriptan (IMITREX) 50 MG tablet Take 1 tablet (50 mg total) by mouth as needed for migraine (may repeat once in 2 hours, no more than  2 tabs in 24 hours). 12 tablet 11   No current facility-administered medications on file prior to visit.     PAST MEDICAL HISTORY: Past Medical History:  Diagnosis Date  . ADHD   . Anemia   . Anxiety   . Anxiety and depression   . History of gestational hypertension   . Leukopenia   . Pre-eclampsia    2010    PAST SURGICAL HISTORY: Past Surgical History:  Procedure Laterality Date  . CESAREAN SECTION     2010  . EYE SURGERY  12/25/2016   Lasix Eye Surgery    SOCIAL HISTORY: Social History   Tobacco Use  . Smoking status: Never Smoker  . Smokeless tobacco: Never Used  Substance Use Topics  . Alcohol use: No  . Drug use: No    FAMILY HISTORY: Family History  Problem Relation Age of Onset  . Hypertension Father   . Diabetes Father        Type 2  . Hyperlipidemia Father   . Heart disease Paternal Grandmother   . Pancreatic cancer Paternal Grandfather     ROS: Review of Systems  Constitutional: Positive for weight loss.  Gastrointestinal: Negative for diarrhea, nausea and vomiting.  Musculoskeletal:       Negative for muscle weakness  Endo/Heme/Allergies:       Negative for Polyphagia    PHYSICAL EXAM: Blood pressure 119/78, pulse 71, temperature 97.8 F (36.6 C), temperature source Oral, height 5\' 6"  (1.676 m),  weight 195 lb (88.5 kg), SpO2 100 %. Body mass index is 31.47 kg/m. Physical Exam Vitals signs reviewed.  Constitutional:      Appearance: Normal appearance. She is well-developed. She is obese.  Cardiovascular:     Rate and Rhythm: Normal rate.  Pulmonary:     Effort: Pulmonary effort is normal.  Musculoskeletal: Normal range of motion.  Skin:    General: Skin is warm and dry.  Neurological:     Mental Status: She is alert and oriented to person, place, and time.  Psychiatric:        Mood and Affect: Mood normal.        Behavior: Behavior normal.     RECENT LABS AND TESTS: BMET    Component Value Date/Time   NA 139 06/16/2018  1238   K 4.4 06/16/2018 1238   CL 97 06/16/2018 1238   CO2 21 06/16/2018 1238   GLUCOSE 73 06/16/2018 1238   GLUCOSE 89 04/07/2018 0916   BUN 10 06/16/2018 1238   CREATININE 0.84 06/16/2018 1238   CREATININE 0.75 02/19/2017 1606   CALCIUM 9.5 06/16/2018 1238   GFRNONAA 90 06/16/2018 1238   GFRAA 103 06/16/2018 1238   Lab Results  Component Value Date   HGBA1C 5.4 06/16/2018   Lab Results  Component Value Date   INSULIN 11.9 06/16/2018   CBC    Component Value Date/Time   WBC 5.4 04/07/2018 0916   RBC 4.26 04/07/2018 0916   HGB 12.8 04/07/2018 0916   HCT 38.3 04/07/2018 0916   PLT 380.0 04/07/2018 0916   MCV 89.8 04/07/2018 0916   MCH 30.9 02/19/2017 1606   MCHC 33.4 04/07/2018 0916   RDW 14.2 04/07/2018 0916   LYMPHSABS 1.7 04/07/2018 0916   MONOABS 0.3 04/07/2018 0916   EOSABS 0.1 04/07/2018 0916   BASOSABS 0.0 04/07/2018 0916   Iron/TIBC/Ferritin/ %Sat No results found for: IRON, TIBC, FERRITIN, IRONPCTSAT Lipid Panel     Component Value Date/Time   CHOL 196 06/16/2018 1238   TRIG 139 06/16/2018 1238   HDL 64 06/16/2018 1238   CHOLHDL 3 04/07/2018 0916   VLDL 27.0 04/07/2018 0916   LDLCALC 104 (H) 06/16/2018 1238   Hepatic Function Panel     Component Value Date/Time   PROT 6.8 06/16/2018 1238   ALBUMIN 4.4 06/16/2018 1238   AST 44 (H) 06/16/2018 1238   ALT 68 (H) 06/16/2018 1238   ALKPHOS 74 06/16/2018 1238   BILITOT 0.5 06/16/2018 1238   BILIDIR 0.1 05/11/2012 1204      Component Value Date/Time   TSH 2.680 06/16/2018 1238   TSH 2.29 04/07/2018 0916   TSH 1.33 02/19/2017 1606    Ref. Range 06/16/2018 12:38  Vitamin D, 25-Hydroxy Latest Ref Range: 30.0 - 100.0 ng/mL 24.2 (L)    ASSESSMENT AND PLAN: Vitamin D deficiency - Plan: Vitamin D, Ergocalciferol, (DRISDOL) 1.25 MG (50000 UT) CAPS capsule  Insulin resistance  At risk for osteoporosis  Class 1 obesity with serious comorbidity and body mass index (BMI) of 31.0 to 31.9 in adult,  unspecified obesity type  PLAN:  Vitamin D Deficiency [name] was informed that low vitamin D levels contributes to fatigue and are associated with obesity, breast, and colon cancer. She agrees to continue to take prescription Vit D @50 ,000 IU every week # 4 with no refills and will follow up for routine testing of vitamin D, at least 2-3 times per year. She was informed of the risk of over-replacement of vitamin D and  agrees to not increase her dose unless she discusses this with [place] first. We will check labs at the next visit and she agrees to follow up with our clinic in 3 to 4 weeks.  At risk for osteopenia and osteoporosis [name] was given extended  (15 minutes) osteoporosis prevention counseling today. [name] is at risk for osteopenia and osteoporosis due to her vitamin D deficiency. She was encouraged to take her vitamin D and follow her higher calcium diet and increase strengthening exercise to help strengthen her bones and decrease her risk of osteopenia and osteoporosis.  Insulin Resistance [name] will continue to work on weight loss, exercise, and decreasing simple carbohydrates in her diet to help decrease the risk of diabetes. She was informed that eating too many simple carbohydrates or too many calories at one sitting increases the likelihood of GI side effects. We will check labs at the next visit and [name] agreed to follow up with [place] as directed to monitor her progress.   Obesity [name] is currently in the action stage of change. As such, her goal is to continue with weight loss efforts She has agreed to follow the Category 3 plan [name] has been instructed to work up to a goal of 150 minutes of combined cardio and strengthening exercise per week for weight loss and overall health benefits. We discussed the following Behavioral Modification Strategies today: work on meal planning and easy cooking plans and ways to avoid boredom eating  [name] has agreed to follow  up with our clinic in 3 to 4 weeks. She was informed of the importance of frequent follow up visits to maximize her success with intensive lifestyle modifications for her multiple health conditions.   OBESITY BEHAVIORAL INTERVENTION VISIT  Today's visit was # 5   Starting weight: 200 lbs Starting date: 06/16/18 Today's weight : 195 lbs Today's date: 09/08/2018 Total lbs lost to date: 5   ASK: We discussed the diagnosis of obesity with [name] today and [name] agreed to give [place] permission to discuss obesity behavioral modification therapy today.  ASSESS: [name] has the diagnosis of obesity and her BMI today is 31.49 [name] is in the action stage of change   ADVISE: [name] was educated on the multiple health risks of obesity as well as the benefit of weight loss to improve her health. She was advised of the need for long term treatment and the importance of lifestyle modifications to improve her current health and to decrease her risk of future health problems.  AGREE: Multiple dietary modification options and treatment options were discussed and  [name] agreed to follow the recommendations documented in the above note.  ARRANGE: [name] was educated on the importance of frequent visits to treat obesity as outlined per CMS and USPSTF guidelines and agreed to schedule her next follow up appointment today.  Corey Skains, am acting as transcriptionist for Abby Potash, PA-C I, Abby Potash, PA-C have reviewed above note and agree with its content

## 2018-10-06 ENCOUNTER — Ambulatory Visit: Payer: BLUE CROSS/BLUE SHIELD | Admitting: Family Medicine

## 2018-10-10 ENCOUNTER — Encounter: Payer: Self-pay | Admitting: Family Medicine

## 2018-10-10 ENCOUNTER — Ambulatory Visit: Payer: BLUE CROSS/BLUE SHIELD | Admitting: Family Medicine

## 2018-10-10 VITALS — BP 126/80 | HR 89 | Temp 99.1°F | Resp 16 | Ht 66.0 in | Wt 203.4 lb

## 2018-10-10 DIAGNOSIS — F419 Anxiety disorder, unspecified: Secondary | ICD-10-CM

## 2018-10-10 DIAGNOSIS — Z6841 Body Mass Index (BMI) 40.0 and over, adult: Secondary | ICD-10-CM | POA: Diagnosis not present

## 2018-10-10 DIAGNOSIS — F988 Other specified behavioral and emotional disorders with onset usually occurring in childhood and adolescence: Secondary | ICD-10-CM | POA: Diagnosis not present

## 2018-10-10 DIAGNOSIS — Z79899 Other long term (current) drug therapy: Secondary | ICD-10-CM

## 2018-10-10 MED ORDER — ESCITALOPRAM OXALATE 20 MG PO TABS: 20.0000 mg | ORAL_TABLET | Freq: Every day | ORAL | 3 refills | Status: DC

## 2018-10-10 MED ORDER — METHYLPHENIDATE HCL ER (OSM) 54 MG PO TBCR: 54.0000 mg | EXTENDED_RELEASE_TABLET | ORAL | 0 refills | Status: DC

## 2018-10-10 NOTE — Assessment & Plan Note (Signed)
Stable con't meds 

## 2018-10-10 NOTE — Patient Instructions (Signed)
Attention Deficit Hyperactivity Disorder, Adult Attention deficit hyperactivity disorder (ADHD) is a mental health disorder that starts during childhood. For many people with ADHD, the disorder continues into adult years. There are many things that you and your health care provider or therapist (mental health professional) can do to manage your symptoms. What are the causes? The exact cause of ADHD is not known. What increases the risk? You are more likely to develop this condition if:  You have a family history of ADHD.  You are female.  You were born to a mother who smoked or drank alcohol during pregnancy.  You were exposed to lead poisoning or other toxins in the womb or in early life.  You were born before 37 weeks of pregnancy (prematurely) or you had a low birth weight.  You have experienced a brain injury. What are the signs or symptoms? Symptoms of this condition depend on the type of ADHD. The two main types are inattentive and hyperactive-impulsive. Some people may have symptoms of both types. Symptoms of the inattentive type include:  Difficulty watching, listening, or thinking with focused effort (paying attention).  Making careless mistakes.  Not listening.  Not following instructions.  Being disorganized.  Avoiding tasks that require time and attention.  Losing things.  Forgetting things.  Being easily distracted. Symptoms of the hyperactive-impulsive type include:  Restlessness.  Talking too much.  Interrupting.  Difficulty with: ? Sitting still. ? Staying quiet. ? Feeling motivated. ? Relaxing. ? Waiting in line or waiting for a turn. How is this diagnosed? This condition is diagnosed based on your current symptoms and your history of symptoms. The diagnosis can be made by a provider such as a primary care provider, psychiatrist, psychologist, or clinical social worker. The provider may use a symptom checklist or a standardized behavior rating  scale to evaluate your symptoms. He or she may want to talk with family members who have known you for a long time and have observed your behaviors. There are no lab tests or brain imaging tests that can diagnose ADHD. How is this treated? This condition can be treated with medicines and behavior therapy. Medicines may be the best option to reduce impulsive behaviors and improve attention. Your health care provider may recommend:  Stimulant medicines. These are the most common medicines used for adult ADHD. They affect certain chemicals in the brain (neurotransmitters). These medicines may be long-acting or short-acting. This will determine how often you need to take the medicine.  A non-stimulant medicine for adult ADHD (atomoxetine). This medicine increases a neurotransmitter called norepinephrine. It may take weeks to months to see effects from this medicine. Psychotherapy and behavioral management are also important for treating ADHD. Psychotherapy is often used along with medicine. Your health care provider may suggest:  Cognitive behavioral therapy (CBT). This type of therapy teaches you to replace negative thoughts and actions with positive thoughts and actions. When used as part of ADHD treatment, this therapy may also include: ? Coping strategies for organization, time management, impulse control, and stress reduction. ? Mindfulness and meditation training.  Behavioral management. This may include strategies for organization and time management. You may work with an ADHD coach who is specially trained to help people with ADHD to manage and organize activities and to function more effectively. Follow these instructions at home: Medicines   Take over-the-counter and prescription medicines only as told by your health care provider.  Talk with your health care provider about the possible side effects of your   medicine to watch for. General instructions   Learn as much as you can about  adult ADHD, and work closely with your health care providers to find the treatments that work best for you.  Do not use drugs or abuse alcohol. Limit alcohol intake to no more than 1 drink a day for nonpregnant women and 2 drinks a day for men. One drink equals 12 oz of beer, 5 oz of wine, or 1 oz of hard liquor.  Follow the same schedule each day. Make sure your schedule includes enough time for you to get plenty of sleep.  Use reminder devices like notes, calendars, and phone apps to stay on-time and organized.  Eat a healthy diet. Do not skip meals.  Exercise regularly. Exercise can help to reduce stress and anxiety.  Keep all follow-up visits as told by your health care provider and therapist. This is important. Where to find more information  A health care provider may be able to recommend resources that are available online or over the phone. You could start with: ? Attention Deficit Disorder Association (ADDA): www.add.org ? National Institute of Mental Health (NIMH): www.nimh.nih.gov Contact a health care provider if:  Your symptoms are changing, getting worse, or not improving.  You have side effects from your medicine, such as: ? Repeated muscle twitches, coughing, or speech outbursts. ? Sleep problems. ? Loss of appetite. ? Depression. ? New or worsening behavior problems. ? Dizziness. ? Unusually fast heartbeat. ? Stomach pains. ? Headaches.  You are struggling with anxiety, depression, or substance abuse. Get help right away if:  You have a severe reaction to a medicine.  You have thoughts of hurting yourself or others. If you ever feel like you may hurt yourself or others, or have thoughts about taking your own life, get help right away. You can go to the nearest emergency department or call:  Your local emergency services (911 in the [place]  A suicide crisis helpline, such as the National Suicide Prevention Lifeline at 1-800-273-8255. This is open 24 hours a  day. Summary  ADHD is a mental health disorder that starts during childhood and often continues into adult years.  The exact cause of ADHD is not known.  There is no cure for ADHD, but treatment with medicine, therapy, or behavioral training can help you manage your condition. This information is not intended to replace advice given to you by your health care provider. Make sure you discuss any questions you have with your health care provider. Document Released: 05/06/2017 Document Revised: 05/06/2017 Document Reviewed: 05/06/2017 Elsevier Interactive Patient Education  2019 [employer] Inc.  

## 2018-10-10 NOTE — Progress Notes (Signed)
Patient ID: [name], female    DOB: [birth year]  Age: 38 y.o. MRN: [id number]    Subjective:  Subjective  HPI [name] presents for f/u adhd and anxiety.  No complaints   Review of Systems  Constitutional: Negative for chills and fever.  HENT: Negative for congestion and hearing loss.   Eyes: Negative for discharge.  Respiratory: Negative for cough and shortness of breath.   Cardiovascular: Negative for chest pain, palpitations and leg swelling.  Gastrointestinal: Negative for abdominal pain, blood in stool, constipation, diarrhea, nausea and vomiting.  Genitourinary: Negative for dysuria, frequency, hematuria and urgency.  Musculoskeletal: Negative for back pain and myalgias.  Skin: Negative for rash.  Allergic/Immunologic: Negative for environmental allergies.  Neurological: Negative for dizziness, weakness and headaches.  Hematological: Does not bruise/bleed easily.  Psychiatric/Behavioral: Negative for decreased concentration and suicidal ideas. The patient is not nervous/anxious.     History Past Medical History:  Diagnosis Date  . ADHD   . Anemia   . Anxiety   . Anxiety and depression   . History of gestational hypertension   . Leukopenia   . Pre-eclampsia    2010    She has a past surgical history that includes Cesarean section and Eye surgery (12/25/2016).   Her family history includes Diabetes in her father; Heart disease in her paternal grandmother; Hyperlipidemia in her father; Hypertension in her father; Pancreatic cancer in her paternal grandfather.She reports that she has never smoked. She has never used smokeless tobacco. She reports that she does not drink alcohol or use drugs.  Current Outpatient Medications on File Prior to Visit  Medication Sig Dispense Refill  . norethindrone (MICRONOR,CAMILA,ERRIN) 0.35 MG tablet Take 1 tablet by mouth daily.    . SUMAtriptan (IMITREX) 50 MG tablet Take 1 tablet (50 mg total) by mouth as needed  for migraine (may repeat once in 2 hours, no more than 2 tabs in 24 hours). 12 tablet 11  . Vitamin D, Ergocalciferol, (DRISDOL) 1.25 MG (50000 UT) CAPS capsule Take 1 capsule (50,000 Units total) by mouth every 7 (seven) days. 4 capsule 0   No current facility-administered medications on file prior to visit.      Objective:  Objective  Physical Exam Constitutional:      Appearance: She is well-developed.  HENT:     Head: Normocephalic and atraumatic.  Eyes:     Conjunctiva/sclera: Conjunctivae normal.  Neck:     Musculoskeletal: Normal range of motion and neck supple.     Thyroid: No thyromegaly.     Vascular: No carotid bruit or JVD.  Cardiovascular:     Rate and Rhythm: Normal rate and regular rhythm.     Heart sounds: Normal heart sounds. No murmur.  Pulmonary:     Effort: Pulmonary effort is normal. No respiratory distress.     Breath sounds: Normal breath sounds. No wheezing or rales.  Chest:     Chest wall: No tenderness.  Neurological:     Mental Status: She is alert and oriented to person, place, and time.  Psychiatric:        Mood and Affect: Mood normal.        Behavior: Behavior normal.        Thought Content: Thought content normal.        Judgment: Judgment normal.    BP 126/80 (BP Location: Right Arm, Cuff Size: Normal)   Pulse 89   Temp 99.1 F (37.3 C) (Oral)   Resp 16  Ht 5\' 6"  (1.676 m)   Wt 203 lb 6.4 oz (92.3 kg)   SpO2 98%   BMI 32.83 kg/m  Wt Readings from Last 3 Encounters:  10/10/18 203 lb 6.4 oz (92.3 kg)  09/08/18 195 lb (88.5 kg)  08/10/18 197 lb (89.4 kg)     Lab Results  Component Value Date   WBC 5.4 04/07/2018   HGB 12.8 04/07/2018   HCT 38.3 04/07/2018   PLT 380.0 04/07/2018   GLUCOSE 73 06/16/2018   CHOL 196 06/16/2018   TRIG 139 06/16/2018   HDL 64 06/16/2018   LDLCALC 104 (H) 06/16/2018   ALT 68 (H) 06/16/2018   AST 44 (H) 06/16/2018   NA 139 06/16/2018   K 4.4 06/16/2018   CL 97 06/16/2018   CREATININE 0.84  06/16/2018   BUN 10 06/16/2018   CO2 21 06/16/2018   TSH 2.680 06/16/2018   HGBA1C 5.4 06/16/2018    Ct Head Wo Contrast  Result Date: 05/04/2018 CLINICAL DATA:  Acute onset of right-sided headache. EXAM: CT HEAD WITHOUT CONTRAST TECHNIQUE: Contiguous axial images were obtained from the base of the skull through the vertex without intravenous contrast. COMPARISON:  CT of the head performed 12/10/2004 FINDINGS: Brain: No evidence of acute infarction, hemorrhage, hydrocephalus, extra-axial collection or mass lesion/mass effect. The posterior fossa, including the cerebellum, brainstem and fourth ventricle, is within normal limits. The third and lateral ventricles, and basal ganglia are unremarkable in appearance. The cerebral hemispheres are symmetric in appearance, with normal gray-white differentiation. No mass effect or midline shift is seen. Vascular: No hyperdense vessel or unexpected calcification. Skull: There is no evidence of fracture; visualized osseous structures are unremarkable in appearance. Sinuses/Orbits: The orbits are within normal limits. The paranasal sinuses and mastoid air cells are well-aerated. Other: No significant soft tissue abnormalities are seen. IMPRESSION: Unremarkable noncontrast CT of the head. Electronically Signed   By: Garald Balding M.D.   On: 05/04/2018 06:06     Assessment & Plan:  Plan  I have changed [name]'s escitalopram. I am also having her maintain her norethindrone, SUMAtriptan, Vitamin D (Ergocalciferol), and methylphenidate.  Meds ordered this encounter  Medications  . escitalopram (LEXAPRO) 20 MG tablet    Sig: Take 1 tablet (20 mg total) by mouth daily.    Dispense:  90 tablet    Refill:  3  . methylphenidate (CONCERTA) 54 MG PO CR tablet    Sig: Take 1 tablet (54 mg total) by mouth every morning.    Dispense:  90 tablet    Refill:  0    Problem List Items Addressed This Visit      Unprioritized   Anxiety    Stable con't meds        Relevant Medications   escitalopram (LEXAPRO) 20 MG tablet   Attention deficit disorder - Primary    con't meds Stable No side effects      Relevant Medications   methylphenidate (CONCERTA) 54 MG PO CR tablet   Other Relevant Orders   Pain Mgmt, Profile 8 w/Conf, U    Other Visit Diagnoses    High risk medication use       Relevant Orders   Pain Mgmt, Profile 8 w/Conf, U   BMI 40.0-44.9, adult (HCC)       Relevant Medications   methylphenidate (CONCERTA) 54 MG PO CR tablet      Follow-up: Return in about 6 months (around 04/10/2019).  Ann Held, DO

## 2018-10-10 NOTE — Assessment & Plan Note (Signed)
con't meds Stable No side effects

## 2018-10-11 ENCOUNTER — Encounter (INDEPENDENT_AMBULATORY_CARE_PROVIDER_SITE_OTHER): Payer: Self-pay

## 2018-10-11 ENCOUNTER — Ambulatory Visit (INDEPENDENT_AMBULATORY_CARE_PROVIDER_SITE_OTHER): Payer: BLUE CROSS/BLUE SHIELD | Admitting: Family Medicine

## 2018-10-11 LAB — PAIN MGMT, PROFILE 8 W/CONF, U
6 Acetylmorphine: NEGATIVE ng/mL (ref <10)
AMPHETAMINES: NEGATIVE ng/mL (ref <500)
Alcohol Metabolites: NEGATIVE ng/mL (ref <500)
BUPRENORPHINE, URINE: NEGATIVE ng/mL (ref <5)
Benzodiazepines: NEGATIVE ng/mL (ref <100)
COCAINE METABOLITE: NEGATIVE ng/mL (ref <150)
CREATININE: 18.9 mg/dL — AB
MDMA: NEGATIVE ng/mL (ref <500)
Marijuana Metabolite: NEGATIVE ng/mL (ref <20)
OXIDANT: NEGATIVE ug/mL (ref <200)
OXYCODONE: NEGATIVE ng/mL (ref <100)
Opiates: NEGATIVE ng/mL (ref <100)
PH: 6.64 (ref 4.5–9.0)
SPECIFIC GRAVITY: 1.004 (ref >=1.0)

## 2018-10-13 ENCOUNTER — Encounter (INDEPENDENT_AMBULATORY_CARE_PROVIDER_SITE_OTHER): Payer: Self-pay

## 2018-10-13 ENCOUNTER — Ambulatory Visit (INDEPENDENT_AMBULATORY_CARE_PROVIDER_SITE_OTHER): Payer: BLUE CROSS/BLUE SHIELD | Admitting: Physician Assistant

## 2018-12-26 ENCOUNTER — Encounter: Payer: Self-pay | Admitting: Family Medicine

## 2018-12-26 ENCOUNTER — Other Ambulatory Visit: Payer: Self-pay | Admitting: Family Medicine

## 2018-12-26 MED ORDER — TRAZODONE HCL 50 MG PO TABS: 25.0000 mg | ORAL_TABLET | Freq: Every evening | ORAL | 3 refills | Status: DC | PRN

## 2018-12-26 MED ORDER — HYDROXYZINE HCL 10 MG PO TABS: 10.0000 mg | ORAL_TABLET | Freq: Three times a day (TID) | ORAL | 0 refills | Status: DC | PRN

## 2018-12-26 NOTE — Telephone Encounter (Signed)
Thanks for letting us know---   We can know do video visits -- so if you need more help with the anxiety let us know   Dr Kennieth Rad

## 2019-04-10 ENCOUNTER — Encounter: Payer: Self-pay | Admitting: Family Medicine

## 2019-04-10 ENCOUNTER — Ambulatory Visit (INDEPENDENT_AMBULATORY_CARE_PROVIDER_SITE_OTHER): Payer: BLUE CROSS/BLUE SHIELD | Admitting: Family Medicine

## 2019-04-10 DIAGNOSIS — Z20828 Contact with and (suspected) exposure to other viral communicable diseases: Secondary | ICD-10-CM

## 2019-04-10 DIAGNOSIS — F988 Other specified behavioral and emotional disorders with onset usually occurring in childhood and adolescence: Secondary | ICD-10-CM | POA: Diagnosis not present

## 2019-04-10 DIAGNOSIS — Z20822 Contact with and (suspected) exposure to covid-19: Secondary | ICD-10-CM

## 2019-04-10 DIAGNOSIS — F4323 Adjustment disorder with mixed anxiety and depressed mood: Secondary | ICD-10-CM | POA: Diagnosis not present

## 2019-04-10 NOTE — Progress Notes (Signed)
Virtual Visit via Video Note  I connected with [name] on 04/10/19 at  9:15 AM EDT by a video enabled telemedicine application and verified that I am speaking with the correct person using two identifiers.  Location: Patient: home  Provider: office    I discussed the limitations of evaluation and management by telemedicine and the availability of in person appointments. The patient expressed understanding and agreed to proceed.  History of Present Illness: Pt was exposed to covid at work.   A coworker tested positive -- she has no symptoms and is being tested today She needed this visit to f/u anxiety and add but needs no refills today   No complaints.     Observations/Objective: Today's Vitals   04/10/19 0855  Pulse: 77  Temp: 97.7 F (36.5 C)  Weight: 200 lb (90.7 kg)  Height: 5\' 6"  (1.676 m)   Body mass index is 32.28 kg/m. Pt is in NAD Pt denies anxiety / depression    Assessment and Plan: 1. Adjustment disorder with mixed anxiety and depressed mood Stable con't meds   2. Attention deficit disorder (ADD) without hyperactivity Stable con't meds   3. Close Exposure to Covid-19 Virus Pt being tested today   Follow Up Instructions:    I discussed the assessment and treatment plan with the patient. The patient was provided an opportunity to ask questions and all were answered. The patient agreed with the plan and demonstrated an understanding of the instructions.   The patient was advised to call back or seek an in-person evaluation if the symptoms worsen or if the condition fails to improve as anticipated.  I provided 25 minutes of non-face-to-face time during this encounter.   Ann Held, DO

## 2019-04-10 NOTE — Assessment & Plan Note (Signed)
Pt being tested today

## 2019-04-10 NOTE — Assessment & Plan Note (Signed)
Stable con't meds 

## 2019-05-04 ENCOUNTER — Encounter: Payer: Self-pay | Admitting: Family Medicine

## 2019-05-04 DIAGNOSIS — Z79899 Other long term (current) drug therapy: Secondary | ICD-10-CM

## 2019-05-04 DIAGNOSIS — F988 Other specified behavioral and emotional disorders with onset usually occurring in childhood and adolescence: Secondary | ICD-10-CM

## 2019-05-04 DIAGNOSIS — Z111 Encounter for screening for respiratory tuberculosis: Secondary | ICD-10-CM

## 2019-05-05 NOTE — Telephone Encounter (Signed)
Pt called and scheduled lab appt 8/14. Please enter lab orders.

## 2019-05-12 ENCOUNTER — Other Ambulatory Visit (INDEPENDENT_AMBULATORY_CARE_PROVIDER_SITE_OTHER): Payer: BLUE CROSS/BLUE SHIELD

## 2019-05-12 ENCOUNTER — Other Ambulatory Visit: Payer: Self-pay

## 2019-05-12 DIAGNOSIS — F988 Other specified behavioral and emotional disorders with onset usually occurring in childhood and adolescence: Secondary | ICD-10-CM

## 2019-05-12 DIAGNOSIS — Z111 Encounter for screening for respiratory tuberculosis: Secondary | ICD-10-CM

## 2019-05-12 DIAGNOSIS — Z79899 Other long term (current) drug therapy: Secondary | ICD-10-CM | POA: Diagnosis not present

## 2019-05-14 LAB — PAIN MGMT, PROFILE 8 W/CONF, U
6 Acetylmorphine: NEGATIVE ng/mL
Alcohol Metabolites: NEGATIVE ng/mL (ref <500)
Alphahydroxyalprazolam: 26 ng/mL
Alphahydroxymidazolam: NEGATIVE ng/mL
Alphahydroxytriazolam: NEGATIVE ng/mL
Aminoclonazepam: NEGATIVE ng/mL
Amphetamines: NEGATIVE ng/mL
Benzodiazepines: POSITIVE ng/mL
Buprenorphine, Urine: NEGATIVE ng/mL
Cocaine Metabolite: NEGATIVE ng/mL
Creatinine: 193.9 mg/dL
Hydroxyethylflurazepam: NEGATIVE ng/mL
Lorazepam: NEGATIVE ng/mL
MDMA: NEGATIVE ng/mL
Marijuana Metabolite: NEGATIVE ng/mL
Nordiazepam: NEGATIVE ng/mL
Opiates: NEGATIVE ng/mL
Oxazepam: NEGATIVE ng/mL
Oxidant: NEGATIVE ug/mL
Oxycodone: NEGATIVE ng/mL
Temazepam: NEGATIVE ng/mL
pH: 6.1 (ref 4.5–9.0)

## 2019-05-14 LAB — QUANTIFERON-TB GOLD PLUS
Mitogen-NIL: >10 IU/mL
NIL: 0.05 IU/mL
QuantiFERON-TB Gold Plus: NEGATIVE
TB1-NIL: 0 IU/mL
TB2-NIL: 0.06 IU/mL

## 2019-05-23 DIAGNOSIS — Z6832 Body mass index (BMI) 32.0-32.9, adult: Secondary | ICD-10-CM | POA: Diagnosis not present

## 2019-05-23 DIAGNOSIS — Z13220 Encounter for screening for lipoid disorders: Secondary | ICD-10-CM | POA: Diagnosis not present

## 2019-05-23 DIAGNOSIS — Z1329 Encounter for screening for other suspected endocrine disorder: Secondary | ICD-10-CM | POA: Diagnosis not present

## 2019-05-23 DIAGNOSIS — Z131 Encounter for screening for diabetes mellitus: Secondary | ICD-10-CM | POA: Diagnosis not present

## 2019-05-23 DIAGNOSIS — Z01419 Encounter for gynecological examination (general) (routine) without abnormal findings: Secondary | ICD-10-CM | POA: Diagnosis not present

## 2019-05-23 DIAGNOSIS — Z309 Encounter for contraceptive management, unspecified: Secondary | ICD-10-CM | POA: Diagnosis not present

## 2019-05-23 MED FILL — NORLYDA 0.35 MG TABS: 0.35 | 84 days supply | Qty: 84 | Fill #0

## 2019-06-23 ENCOUNTER — Other Ambulatory Visit: Payer: Self-pay | Admitting: Family Medicine

## 2019-06-23 DIAGNOSIS — F988 Other specified behavioral and emotional disorders with onset usually occurring in childhood and adolescence: Secondary | ICD-10-CM

## 2019-06-23 DIAGNOSIS — Z6841 Body Mass Index (BMI) 40.0 and over, adult: Secondary | ICD-10-CM

## 2019-06-23 DIAGNOSIS — F419 Anxiety disorder, unspecified: Secondary | ICD-10-CM

## 2019-06-23 MED ORDER — ESCITALOPRAM OXALATE 20 MG PO TABS: 20.0000 mg | ORAL_TABLET | Freq: Every day | ORAL | 3 refills | Status: DC

## 2019-06-23 MED FILL — ESCITALOPRAM 20 MG TABLET: 20 | 90 days supply | Qty: 90 | Fill #0

## 2019-06-23 NOTE — Telephone Encounter (Signed)
Requesting: Concerta Contract: N/A UDS: 05/12/2019, low risk Last OV: 04/10/2019 Next OV: N/A Last Refill: 10/10/2018, #90--0 RF Database:   Please advise

## 2019-06-23 NOTE — Telephone Encounter (Signed)
Medication Refill - Medication: escitalopram (LEXAPRO) 20 MG tablet + methylphenidate (CONCERTA) 54 MG PO CR tablet    Has the patient contacted their pharmacy? Yes.   (Agent: If no, request that the patient contact the pharmacy for the refill.) (Agent: If yes, when and what did the pharmacy advise?)  Preferred Pharmacy (with phone number or street name):  [facility], [place].  [address] [place] [address]  Phone: [phone] Fax: [phone]     Agent: Please be advised that RX refills may take up to 3 business days. We ask that you follow-up with your pharmacy.

## 2019-06-23 NOTE — Telephone Encounter (Signed)
Requested medication (s) are due for refill today: yes  Requested medication (s) are on the active medication list: yes  Last refill:  10/10/2018  Future visit scheduled: no  Notes to clinic:  Review for refill  Requested Prescriptions  Pending Prescriptions Disp Refills   escitalopram (LEXAPRO) 20 MG tablet 90 tablet 3    Sig: Take 1 tablet (20 mg total) by mouth daily.     Psychiatry:  Antidepressants - SSRI Failed - 06/23/2019  3:05 PM      Failed - Completed PHQ-2 or PHQ-9 in the last 360 days.      Passed - Valid encounter within last 6 months    Recent Outpatient Visits          2 months ago Adjustment disorder with mixed anxiety and depressed mood   [facility] [place], [place]   8 months ago Attention deficit disorder, unspecified hyperactivity presence   [facility] [place], [place]   1 year ago Preventative health care   [place] [facility] [place], [place]   1 year ago High risk medication use   [facility] [place], [place]   2 years ago Adjustment disorder with mixed anxiety and depressed mood   [facility] at [facility] [place], [place]              methylphenidate (CONCERTA) 54 MG PO CR tablet 90 tablet 0    Sig: Take 1 tablet (54 mg total) by mouth every morning.     Not Delegated - Psychiatry:  Stimulants/ADHD Failed - 06/23/2019  3:05 PM      Failed - This refill cannot be delegated      Passed - Urine Drug Screen completed in last 360 days.      Passed - Valid encounter within last 3 months    Recent Outpatient Visits          2 months ago Adjustment disorder with mixed anxiety and depressed mood   [facility] at [place], [place]   8 months ago Attention deficit disorder,  unspecified hyperactivity presence   [facility] at [place], [place]   1 year ago Preventative health care   [place] [facility] [place], [place]   1 year ago High risk medication use   [facility] [place], [place]   2 years ago Adjustment disorder with mixed anxiety and depressed mood   [facility] at [place], [place]

## 2019-06-24 MED ORDER — METHYLPHENIDATE HCL ER (OSM) 54 MG PO TBCR: 54.0000 mg | EXTENDED_RELEASE_TABLET | ORAL | 0 refills | Status: DC

## 2019-06-26 MED FILL — CONCERTA 54 MG TABLET ER: 54 | 90 days supply | Qty: 90 | Fill #0

## 2019-07-07 MED FILL — CONCERTA 54 MG TABLET ER: 54 | 90 days supply | Qty: 90 | Fill #0

## 2019-07-07 MED FILL — ESCITALOPRAM 20 MG TABLET: 20 | 90 days supply | Qty: 90 | Fill #0

## 2019-08-04 ENCOUNTER — Other Ambulatory Visit: Payer: Self-pay | Admitting: Neurology

## 2019-09-16 ENCOUNTER — Other Ambulatory Visit: Payer: Self-pay | Admitting: Neurology

## 2019-09-25 ENCOUNTER — Other Ambulatory Visit: Payer: Self-pay | Admitting: Family Medicine

## 2019-09-25 DIAGNOSIS — Z6841 Body Mass Index (BMI) 40.0 and over, adult: Secondary | ICD-10-CM

## 2019-09-25 DIAGNOSIS — F988 Other specified behavioral and emotional disorders with onset usually occurring in childhood and adolescence: Secondary | ICD-10-CM

## 2019-09-26 ENCOUNTER — Other Ambulatory Visit: Payer: Self-pay | Admitting: Family Medicine

## 2019-09-26 DIAGNOSIS — Z6841 Body Mass Index (BMI) 40.0 and over, adult: Secondary | ICD-10-CM

## 2019-09-26 DIAGNOSIS — F988 Other specified behavioral and emotional disorders with onset usually occurring in childhood and adolescence: Secondary | ICD-10-CM

## 2019-09-26 MED ORDER — METHYLPHENIDATE HCL ER (OSM) 54 MG PO TBCR: 54.0000 mg | EXTENDED_RELEASE_TABLET | ORAL | 0 refills | Status: DC

## 2019-09-26 NOTE — Telephone Encounter (Signed)
Requesting:concerta Contract:none, needs csc UDS:05/12/2019 Last Visit:04/10/2019 Next Visit:none scheduled Last Refill:06/24/2019  Please Advise

## 2020-01-22 MED FILL — ESCITALOPRAM 20 MG TABLET: 20 | 90 days supply | Qty: 90 | Fill #1

## 2020-02-16 ENCOUNTER — Encounter: Payer: Self-pay | Admitting: Family Medicine

## 2020-02-16 ENCOUNTER — Other Ambulatory Visit: Payer: Self-pay | Admitting: Family Medicine

## 2020-02-16 DIAGNOSIS — Z6841 Body Mass Index (BMI) 40.0 and over, adult: Secondary | ICD-10-CM

## 2020-02-16 DIAGNOSIS — F988 Other specified behavioral and emotional disorders with onset usually occurring in childhood and adolescence: Secondary | ICD-10-CM

## 2020-02-16 NOTE — Telephone Encounter (Signed)
Requesting: Concerta Contract:  UDS: 05/12/2019 Last OV: 04/10/2019 Next OV: N/A Last Refill: 09/26/2019, #30--0 RF  Database:   Please advise

## 2020-02-16 NOTE — Telephone Encounter (Signed)
She needs ov 

## 2020-03-29 IMAGING — CT CT HEAD W/O CM
4 series · 16 of 47 positions shown, 18 images · non-contrast
Comparison: CT of the head performed 12/10/2004

CLINICAL DATA: Acute onset of right-sided headache.

EXAM:
CT HEAD WITHOUT CONTRAST
TECHNIQUE: Contiguous axial images were obtained from the base of the skull
through the vertex without intravenous contrast.

[Series 3: head without · axial · non-contrast · 0.40mm/px · z∈[-28,+92]mm · 7 of 33 slices shown, 9 images]
[im 5/33  brain]
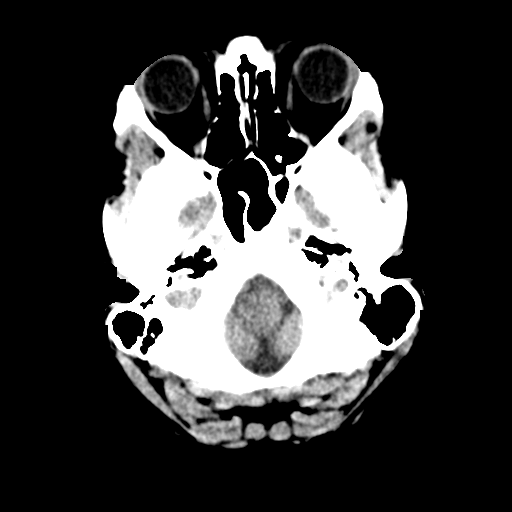
[im 5/33  bone]
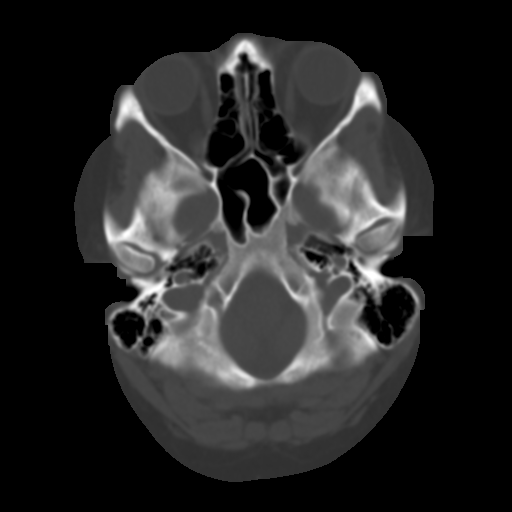
[im 9/33  brain]
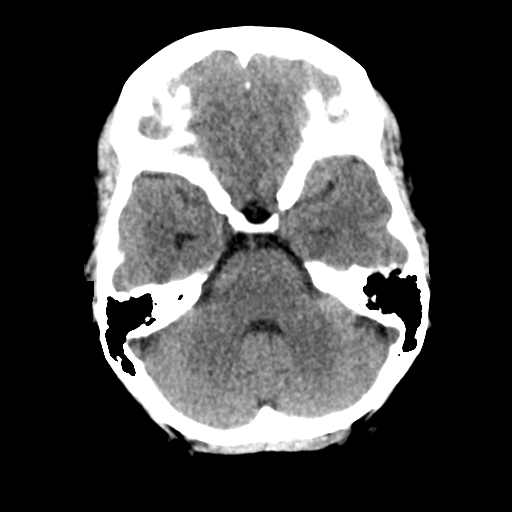
[im 13/33  brain]
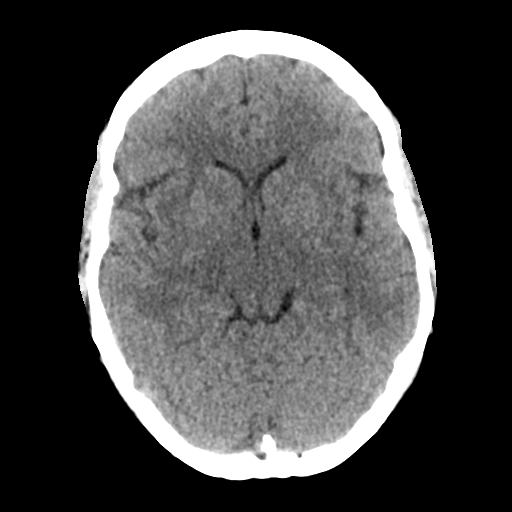
[im 17/33  brain]
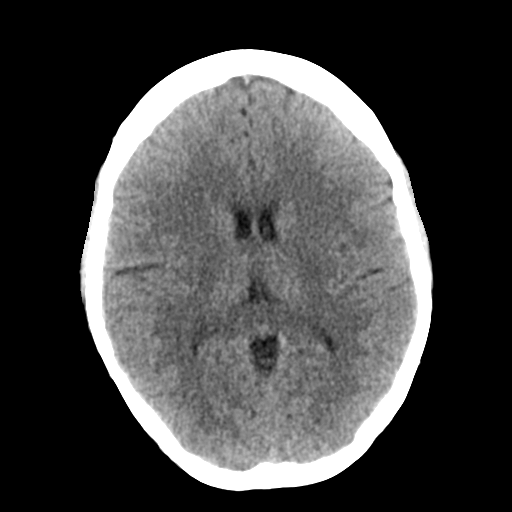
[im 21/33  brain]
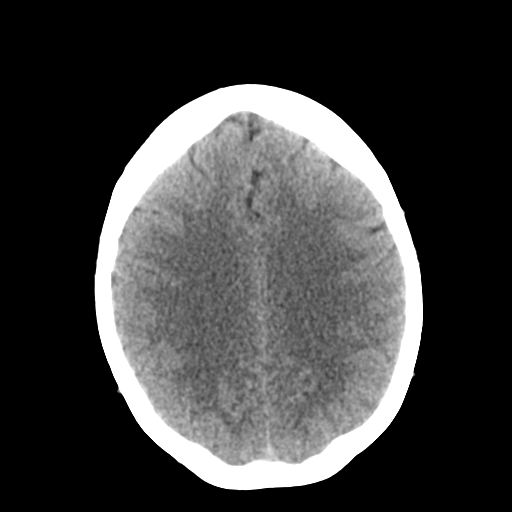
[im 21/33  bone]
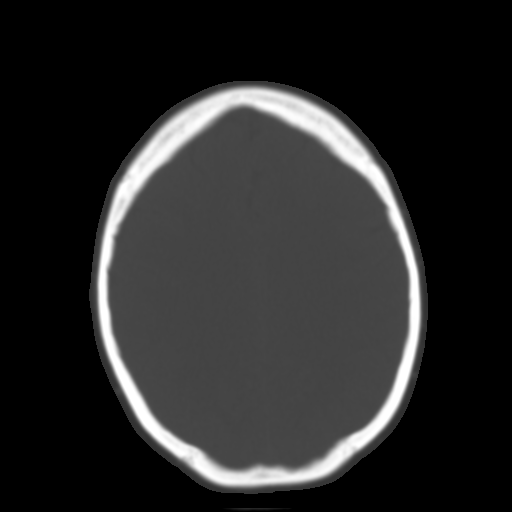
[im 25/33  brain]
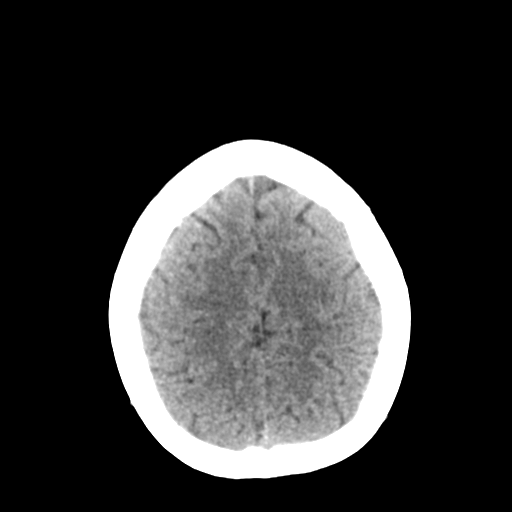
[im 29/33  brain]
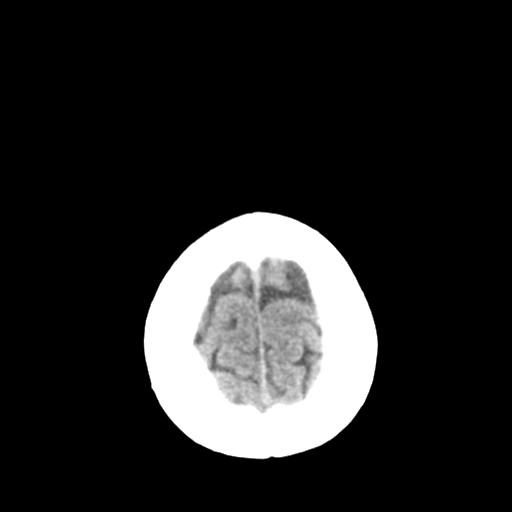

[Series 4: head bone · axial · 0.40mm/px · z∈[-32,+0]mm · 3 of 81 slices shown]
[im 9/81  bone]
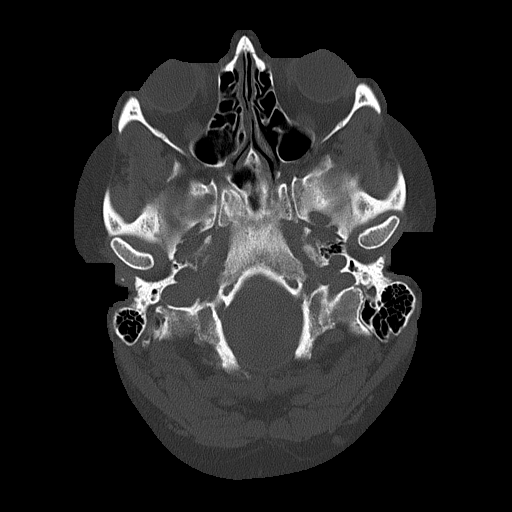
[im 17/81  bone]
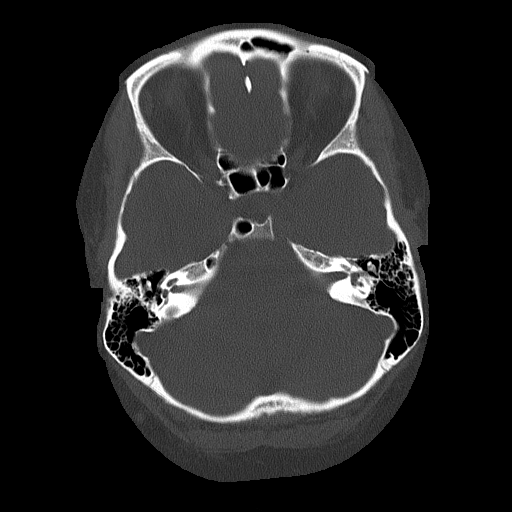
[im 25/81  bone]
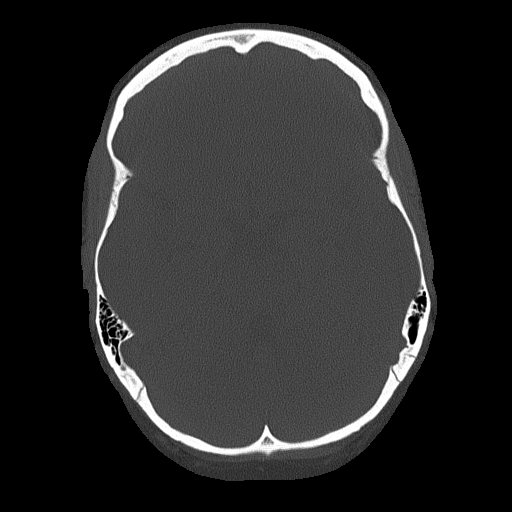

[Series 5: head without cor · coronal · non-contrast · 0.31mm/px · 3 of 64 slices shown]
[im 22/64  brain]
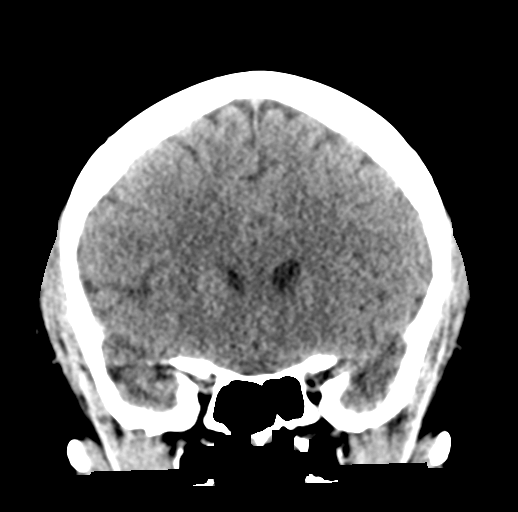
[im 29/64  brain]
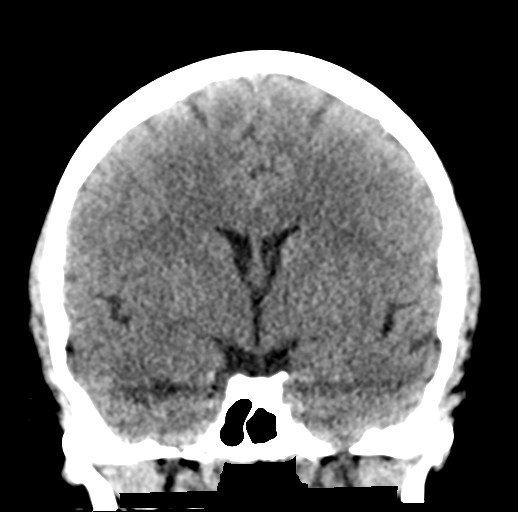
[im 36/64  brain]
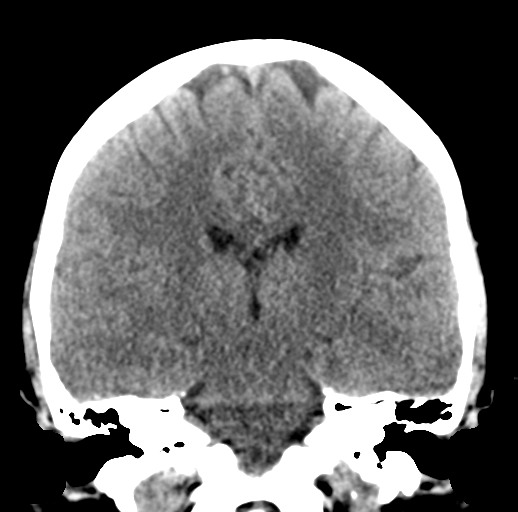

[Series 6: head without sag · sagittal · non-contrast · 0.30mm/px · 3 of 51 slices shown]
[im 17/51  brain]
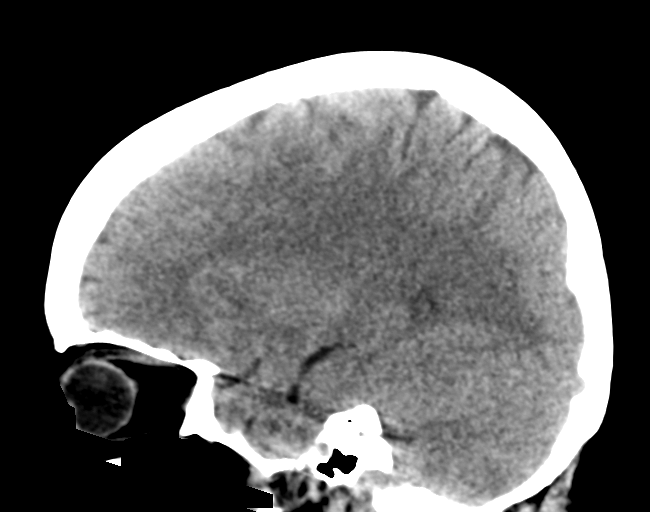
[im 26/51  brain]
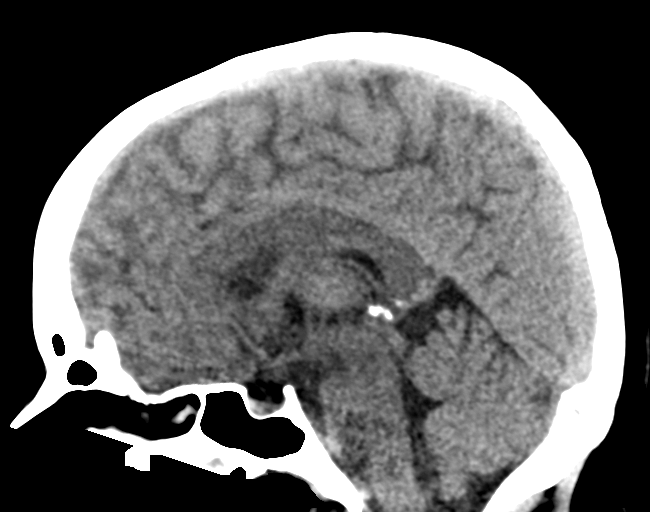
[im 34/51  brain]
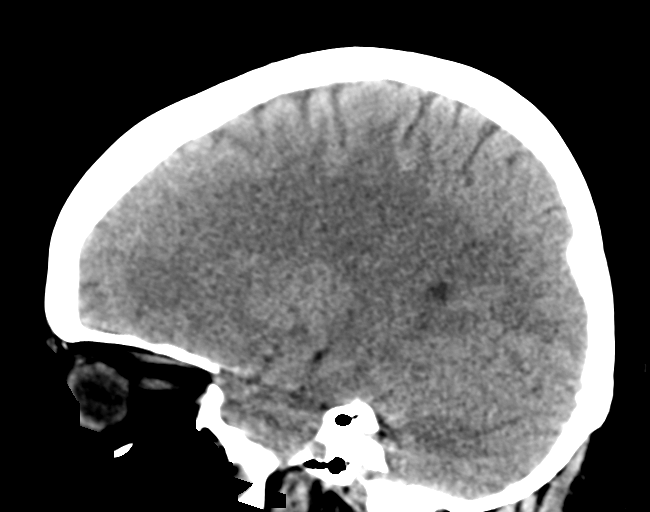

[16 of 47 positions shown; findings below may reference images not displayed]

FINDINGS: Brain: No evidence of acute infarction, hemorrhage, hydrocephalus,
extra-axial collection or mass lesion/mass effect.

The posterior fossa, including the cerebellum, brainstem and fourth
ventricle, is within normal limits. The third and lateral
ventricles, and basal ganglia are unremarkable in appearance. The
cerebral hemispheres are symmetric in appearance, with normal
gray-white differentiation. No mass effect or midline shift is seen.

Vascular: No hyperdense vessel or unexpected calcification.

Skull: There is no evidence of fracture; visualized osseous
structures are unremarkable in appearance.

Sinuses/Orbits: The orbits are within normal limits. The paranasal
sinuses and mastoid air cells are well-aerated.

Other: No significant soft tissue abnormalities are seen.
IMPRESSION: Unremarkable noncontrast CT of the head.

## 2020-04-29 MED FILL — ESCITALOPRAM 20 MG TABLET: 20 | 90 days supply | Qty: 90 | Fill #2

## 2020-05-01 DIAGNOSIS — H52223 Regular astigmatism, bilateral: Secondary | ICD-10-CM | POA: Diagnosis not present

## 2020-05-08 MED FILL — ESCITALOPRAM 20 MG TABLET: 20 | 90 days supply | Qty: 90 | Fill #2

## 2020-05-09 ENCOUNTER — Ambulatory Visit (INDEPENDENT_AMBULATORY_CARE_PROVIDER_SITE_OTHER): Payer: 59 | Admitting: Family Medicine

## 2020-05-09 ENCOUNTER — Encounter: Payer: Self-pay | Admitting: Family Medicine

## 2020-05-09 ENCOUNTER — Other Ambulatory Visit: Payer: Self-pay

## 2020-05-09 VITALS — BP 110/80 | HR 112 | Temp 99.3°F | Resp 18 | Ht 66.0 in | Wt 210.8 lb

## 2020-05-09 DIAGNOSIS — Z0000 Encounter for general adult medical examination without abnormal findings: Secondary | ICD-10-CM | POA: Diagnosis not present

## 2020-05-09 DIAGNOSIS — F419 Anxiety disorder, unspecified: Secondary | ICD-10-CM

## 2020-05-09 DIAGNOSIS — Z79899 Other long term (current) drug therapy: Secondary | ICD-10-CM | POA: Diagnosis not present

## 2020-05-09 DIAGNOSIS — Z6841 Body Mass Index (BMI) 40.0 and over, adult: Secondary | ICD-10-CM

## 2020-05-09 DIAGNOSIS — F988 Other specified behavioral and emotional disorders with onset usually occurring in childhood and adolescence: Secondary | ICD-10-CM | POA: Diagnosis not present

## 2020-05-09 DIAGNOSIS — G43819 Other migraine, intractable, without status migrainosus: Secondary | ICD-10-CM | POA: Diagnosis not present

## 2020-05-09 MED ORDER — ESCITALOPRAM OXALATE 20 MG PO TABS: 20.0000 mg | ORAL_TABLET | Freq: Every day | ORAL | 3 refills | Status: DC

## 2020-05-09 MED ORDER — METHYLPHENIDATE HCL ER (OSM) 54 MG PO TBCR: 54.0000 mg | EXTENDED_RELEASE_TABLET | ORAL | 0 refills | Status: DC

## 2020-05-09 MED ORDER — SUMATRIPTAN SUCCINATE 50 MG PO TABS: ORAL_TABLET | ORAL | 5 refills | Status: DC

## 2020-05-09 NOTE — Patient Instructions (Signed)
Preventive Care 21-39 Years Old, Female Preventive care refers to visits with your health care provider and lifestyle choices that can promote health and wellness. This includes:  A yearly physical exam. This may also be called an annual well check.  Regular dental visits and eye exams.  Immunizations.  Screening for certain conditions.  Healthy lifestyle choices, such as eating a healthy diet, getting regular exercise, not using drugs or products that contain nicotine and tobacco, and limiting alcohol use. What can I expect for my preventive care visit? Physical exam Your health care provider will check your:  Height and weight. This may be used to calculate body mass index (BMI), which tells if you are at a healthy weight.  Heart rate and blood pressure.  Skin for abnormal spots. Counseling Your health care provider may ask you questions about your:  Alcohol, tobacco, and drug use.  Emotional well-being.  Home and relationship well-being.  Sexual activity.  Eating habits.  Work and work environment.  Method of birth control.  Menstrual cycle.  Pregnancy history. What immunizations do I need?  Influenza (flu) vaccine  This is recommended every year. Tetanus, diphtheria, and pertussis (Tdap) vaccine  You may need a Td booster every 10 years. Varicella (chickenpox) vaccine  You may need this if you have not been vaccinated. Human papillomavirus (HPV) vaccine  If recommended by your health care provider, you may need three doses over 6 months. Measles, mumps, and rubella (MMR) vaccine  You may need at least one dose of MMR. You may also need a second dose. Meningococcal conjugate (MenACWY) vaccine  One dose is recommended if you are age 38-38 years and a first-year college student living in a residence hall, or if you have one of several medical conditions. You may also need additional booster doses. Pneumococcal conjugate (PCV13) vaccine  You may need  this if you have certain conditions and were not previously vaccinated. Pneumococcal polysaccharide (PPSV23) vaccine  You may need one or two doses if you smoke cigarettes or if you have certain conditions. Hepatitis A vaccine  You may need this if you have certain conditions or if you travel or work in places where you may be exposed to hepatitis A. Hepatitis B vaccine  You may need this if you have certain conditions or if you travel or work in places where you may be exposed to hepatitis B. Haemophilus influenzae type b (Hib) vaccine  You may need this if you have certain conditions. You may receive vaccines as individual doses or as more than one vaccine together in one shot (combination vaccines). Talk with your health care provider about the risks and benefits of combination vaccines. What tests do I need?  Blood tests  Lipid and cholesterol levels. These may be checked every 5 years starting at age 38.  Hepatitis C test.  Hepatitis B test. Screening  Diabetes screening. This is done by checking your blood sugar (glucose) after you have not eaten for a while (fasting).  Sexually transmitted disease (STD) testing.  BRCA-related cancer screening. This may be done if you have a family history of breast, ovarian, tubal, or peritoneal cancers.  Pelvic exam and Pap test. This may be done every 3 years starting at age 38. Starting at age 30, this may be done every 5 years if you have a Pap test in combination with an HPV test. Talk with your health care provider about your test results, treatment options, and if necessary, the need for more tests.   Follow these instructions at home: Eating and drinking   Eat a diet that includes fresh fruits and vegetables, whole grains, lean protein, and low-fat dairy.  Take vitamin and mineral supplements as recommended by your health care provider.  Do not drink alcohol if: ? Your health care provider tells you not to drink. ? You are  pregnant, may be pregnant, or are planning to become pregnant.  If you drink alcohol: ? Limit how much you have to 0-1 drink a day. ? Be aware of how much alcohol is in your drink. In the [place] one drink equals one 12 oz bottle of beer (355 mL), one 5 oz glass of wine (148 mL), or one 1 oz glass of hard liquor (44 mL). Lifestyle  Take daily care of your teeth and gums.  Stay active. Exercise for at least 30 minutes on 5 or more days each week.  Do not use any products that contain nicotine or tobacco, such as cigarettes, e-cigarettes, and chewing tobacco. If you need help quitting, ask your health care provider.  If you are sexually active, practice safe sex. Use a condom or other form of birth control (contraception) in order to prevent pregnancy and STIs (sexually transmitted infections). If you plan to become pregnant, see your health care provider for a preconception visit. What's next?  Visit your health care provider once a year for a well check visit.  Ask your health care provider how often you should have your eyes and teeth checked.  Stay up to date on all vaccines. This information is not intended to replace advice given to you by your health care provider. Make sure you discuss any questions you have with your health care provider. Document Revised: 05/26/2018 Document Reviewed: 05/26/2018 Elsevier Patient Education  2020 [employer].

## 2020-05-09 NOTE — Progress Notes (Signed)
Subjective:     [name] is a 39 y.o. female and is here for a comprehensive physical exam. The patient reports no problems.  Social History   Socioeconomic History  . Marital status: Married    Spouse name: Brookelle Pellicane  . Number of children: 1  . Years of education: Not on file  . Highest education level: Not on file  Occupational History  . 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 Smoking status: Never Smoker  . Smokeless tobacco: Never Used  Substance and Sexual Activity  . Alcohol use: No  . Drug use: No  . Sexual activity: Yes    Partners: Male  Other Topics Concern  . Not on file  Social History Narrative  . Not on file   Social Determinants of Health   Financial Resource Strain:   . Difficulty of Paying Living Expenses:   Food Insecurity:   . Worried About Charity fundraiser in the Last Year:   . Arboriculturist in the Last Year:   Transportation Needs:   . Film/video editor (Medical):   Marland Kitchen Lack of Transportation (Non-Medical):   Physical Activity:   . Days of Exercise per Week:   . Minutes of Exercise per Session:   Stress:   . Feeling of Stress :   Social Connections:   . Frequency of Communication with Friends and Family:   . Frequency of Social Gatherings with Friends and Family:   . Attends Religious Services:   . Active Member of Clubs or Organizations:   . Attends Archivist Meetings:   Marland Kitchen Marital Status:   Intimate Partner Violence:   . Fear of Current or Ex-Partner:   . Emotionally Abused:   Marland Kitchen Physically Abused:   . Sexually Abused:    Health Maintenance  Topic Date Due  . Hepatitis C Screening  Never done  . COVID-19 Vaccine (1) Never done  . HIV Screening  Never done  . PAP SMEAR-Modifier  04/22/2018  . INFLUENZA VACCINE  04/28/2020  . TETANUS/TDAP  04/28/2024    The following portions of the patient's history were reviewed and updated as appropriate:  She  has a  past medical history of ADHD, Anemia, Anxiety, Anxiety and depression, History of gestational hypertension, Leukopenia, and Pre-eclampsia. She does not have any pertinent problems on file. She  has a past surgical history that includes Cesarean section and Eye surgery (12/25/2016). Her family history includes Diabetes in her father; Heart disease in her paternal grandmother; Hyperlipidemia in her father; Hypertension in her father; Pancreatic cancer in her paternal grandfather. She  reports that she has never smoked. She has never used smokeless tobacco. She reports that she does not drink alcohol and does not use drugs. She has a current medication list which includes the following prescription(s): escitalopram, hydroxyzine, methylphenidate, sumatriptan, and trazodone. Current Outpatient Medications on File Prior to Visit  Medication Sig Dispense Refill  . hydrOXYzine (ATARAX/VISTARIL) 10 MG tablet Take 1 tablet (10 mg total) by mouth 3 (three) times daily as needed. 30 tablet 0  . traZODone (DESYREL) 50 MG tablet Take 0.5-1 tablets (25-50 mg total) by mouth at bedtime as needed for sleep. 30 tablet 3   No current facility-administered medications on file prior to visit.   She is allergic to sulfonamide derivatives..  Review of Systems Review of Systems  Constitutional: Negative for activity change, appetite change and fatigue.  HENT:  Negative for hearing loss, congestion, tinnitus and ear discharge.  dentist q30m Eyes: Negative for visual disturbance (see optho q1y -- vision corrected to 20/20 with glasses).  Respiratory: Negative for cough, chest tightness and shortness of breath.   Cardiovascular: Negative for chest pain, palpitations and leg swelling.  Gastrointestinal: Negative for abdominal pain, diarrhea, constipation and abdominal distention.  Genitourinary: Negative for urgency, frequency, decreased urine volume and difficulty urinating.  Musculoskeletal: Negative for back pain,  arthralgias and gait problem.  Skin: Negative for color change, pallor and rash.  Neurological: Negative for dizziness, light-headedness, numbness and headaches.  Hematological: Negative for adenopathy. Does not bruise/bleed easily.  Psychiatric/Behavioral: Negative for suicidal ideas, confusion, sleep disturbance, self-injury, dysphoric mood, decreased concentration and agitation.       Objective:    BP 110/80 (BP Location: Right Arm, Patient Position: Sitting, Cuff Size: Normal)   Pulse (!) 112   Temp 99.3 F (37.4 C) (Oral)   Resp 18   Ht 5\' 6"  (1.676 m)   Wt 210 lb 12.8 oz (95.6 kg)   LMP 04/30/2020   SpO2 97%   BMI 34.02 kg/m  General appearance: alert, cooperative, appears stated age and no distress Head: Normocephalic, without obvious abnormality, atraumatic Eyes: negative findings: lids and lashes normal, conjunctivae and sclerae normal and pupils equal, round, reactive to light and accomodation Ears: normal TM's and external ear canals both ears Neck: no adenopathy, no carotid bruit, no JVD, supple, symmetrical, trachea midline and thyroid not enlarged, symmetric, no tenderness/mass/nodules Back: symmetric, no curvature. ROM normal. No CVA tenderness. Lungs: clear to auscultation bilaterally Breasts: gyn Heart: regular rate and rhythm, S1, S2 normal, no murmur, click, rub or gallop Abdomen: soft, non-tender; bowel sounds normal; no masses,  no organomegaly Pelvic: deferred Extremities: extremities normal, atraumatic, no cyanosis or edema Pulses: 2+ and symmetric Skin: Skin color, texture, turgor normal. No rashes or lesions Lymph nodes: Cervical, supraclavicular, and axillary nodes normal. Neurologic: Alert and oriented X 3, normal strength and tone. Normal symmetric reflexes. Normal coordination and gait    Assessment:    Healthy female exam.      Plan:     ghm utd Check labs  See After Visit Summary for Counseling Recommendations    1.  Anxiety Stable con't meds  - escitalopram (LEXAPRO) 20 MG tablet; Take 1 tablet (20 mg total) by mouth daily.  Dispense: 90 tablet; Refill: 3  2. BMI 40.0-44.9, adult (HCC) D/w diet and exercise  - methylphenidate (CONCERTA) 54 MG PO CR tablet; Take 1 tablet (54 mg total) by mouth every morning.  Dispense: 30 tablet; Refill: 0  3. Attention deficit disorder, unspecified hyperactivity presence Stable con't meds  uds and contract updated  - methylphenidate (CONCERTA) 54 MG PO CR tablet; Take 1 tablet (54 mg total) by mouth every morning.  Dispense: 30 tablet; Refill: 0 - DRUG MONITORING, PANEL 8 WITH CONFIRMATION, URINE  4. High risk medication use   - DRUG MONITORING, PANEL 8 WITH CONFIRMATION, URINE  5. Other migraine without status migrainosus, intractable Stable ,  Refill meds  - SUMAtriptan (IMITREX) 50 MG tablet; Take 1 tab at onset of migraine.  May repeat in 2 hrs, if needed.  Max dose: 2 tabs/day. #12/30.  Dispense: 12 tablet; Refill: 5  6. Preventative health care See above - Lipid panel; Future - CBC with Differential/Platelet; Future - TSH; Future - Comprehensive metabolic panel; Future

## 2020-05-10 LAB — DRUG MONITORING, PANEL 8 WITH CONFIRMATION, URINE
6 Acetylmorphine: NEGATIVE ng/mL (ref <10)
Alcohol Metabolites: NEGATIVE ng/mL
Amphetamines: NEGATIVE ng/mL (ref <500)
Benzodiazepines: NEGATIVE ng/mL (ref <100)
Buprenorphine, Urine: NEGATIVE ng/mL (ref <5)
Cocaine Metabolite: NEGATIVE ng/mL (ref <150)
Creatinine: 97.7 mg/dL
MDMA: NEGATIVE ng/mL (ref <500)
Marijuana Metabolite: NEGATIVE ng/mL (ref <20)
Opiates: NEGATIVE ng/mL (ref <100)
Oxidant: NEGATIVE ug/mL
Oxycodone: NEGATIVE ng/mL (ref <100)
pH: 5.7 (ref 4.5–9.0)

## 2020-05-10 LAB — DM TEMPLATE

## 2020-05-18 ENCOUNTER — Encounter: Payer: Self-pay | Admitting: Family Medicine

## 2020-05-18 DIAGNOSIS — Z6841 Body Mass Index (BMI) 40.0 and over, adult: Secondary | ICD-10-CM

## 2020-05-18 DIAGNOSIS — F988 Other specified behavioral and emotional disorders with onset usually occurring in childhood and adolescence: Secondary | ICD-10-CM

## 2020-05-20 MED ORDER — METHYLPHENIDATE HCL ER (OSM) 54 MG PO TBCR: 54.0000 mg | EXTENDED_RELEASE_TABLET | ORAL | 0 refills | Status: DC

## 2020-05-20 MED FILL — CONCERTA 54 MG TABLET ER: 54 | 30 days supply | Qty: 30 | Fill #0

## 2020-05-20 NOTE — Telephone Encounter (Signed)
Per Pt   I attempted to fill my concerta at [facility] and insurance denied saying [facility]. Would you mind sending the Rx to the outpatient pharmacy?

## 2020-05-22 ENCOUNTER — Other Ambulatory Visit: Payer: 59

## 2020-07-31 DIAGNOSIS — N920 Excessive and frequent menstruation with regular cycle: Secondary | ICD-10-CM | POA: Diagnosis not present

## 2020-07-31 DIAGNOSIS — Z6833 Body mass index (BMI) 33.0-33.9, adult: Secondary | ICD-10-CM | POA: Diagnosis not present

## 2020-07-31 DIAGNOSIS — Z01419 Encounter for gynecological examination (general) (routine) without abnormal findings: Secondary | ICD-10-CM | POA: Diagnosis not present

## 2020-08-06 ENCOUNTER — Other Ambulatory Visit: Payer: Self-pay | Admitting: Family Medicine

## 2020-08-06 DIAGNOSIS — F419 Anxiety disorder, unspecified: Secondary | ICD-10-CM

## 2020-08-06 MED FILL — ESCITALOPRAM 20 MG TABLET: 20 | 90 days supply | Qty: 90 | Fill #0

## 2020-08-08 DIAGNOSIS — Z23 Encounter for immunization: Secondary | ICD-10-CM | POA: Diagnosis not present

## 2020-08-15 MED FILL — ESCITALOPRAM 20 MG TABLET: 20 | 90 days supply | Qty: 90 | Fill #0

## 2020-08-26 ENCOUNTER — Other Ambulatory Visit (HOSPITAL_COMMUNITY): Payer: Self-pay | Admitting: Radiology

## 2020-08-26 MED FILL — TRANEXAMIC ACID 650 MG TAB: 650 | 5 days supply | Qty: 30 | Fill #0

## 2020-08-27 MED FILL — ESCITALOPRAM 20 MG TABLET: 20 | 90 days supply | Qty: 90 | Fill #0

## 2020-09-15 ENCOUNTER — Ambulatory Visit
Admission: EM | Admit: 2020-09-15 | Discharge: 2020-09-15 | Disposition: A | Discharge status: Home / Self Care | Payer: 59 | Attending: Family Medicine | Admitting: Family Medicine

## 2020-09-15 ENCOUNTER — Ambulatory Visit: Payer: Self-pay

## 2020-09-15 ENCOUNTER — Other Ambulatory Visit: Payer: Self-pay

## 2020-09-15 DIAGNOSIS — M545 Low back pain, unspecified: Secondary | ICD-10-CM

## 2020-09-15 MED ORDER — METHYLPREDNISOLONE ACETATE 40 MG/ML IJ SUSP
40.0000 mg | Freq: Once | INTRAMUSCULAR | Status: AC
  Administered 2020-09-15: 40 mg via INTRAMUSCULAR

## 2020-09-15 MED ORDER — CYCLOBENZAPRINE HCL 5 MG PO TABS
5.0000 mg | ORAL_TABLET | Freq: Two times a day (BID) | ORAL | 0 refills | Status: DC | PRN
Start: 2020-09-15 — End: 2021-05-12

## 2020-09-15 NOTE — ED Triage Notes (Signed)
Patient states that she was going to sit in the floor yesterday and wrap christmas presents. When she got half way to the floor her back started spasming and hurting. Pt states the pain has continued and slightly worsened. Pt is aox4 and ambulatory.

## 2020-09-15 NOTE — Discharge Instructions (Addendum)
Please try heat  Please try range of motion of movements.  Please try lidocaine patches  The muscle relaxer may make you sleepy  Please follow up if your symptoms fail to improve.

## 2020-09-15 NOTE — ED Provider Notes (Signed)
[facility]    CSN: [id number] Arrival date & time: 09/15/20  1049      History   Chief Complaint Chief Complaint  Patient presents with   Back Pain    Since yesterday    HPI [name] is a 39 y.o. female.   She is presenting with acute low back pain.  This started yesterday.  She is having significant pain around the SI joints.  Occurred when she was on the floor.  No radicular symptoms.  No history of surgery.  Has tried ibuprofen.  The pain is more constant in nature.  HPI  Past Medical History:  Diagnosis Date   ADHD    Anemia    Anxiety    Anxiety and depression    History of gestational hypertension    Leukopenia    Pre-eclampsia    2010    Patient Active Problem List   Diagnosis Date Noted   Close exposure to COVID-19 virus 04/10/2019   Chronic migraine 07/12/2018   Attention deficit disorder 03/19/2017   Adjustment disorder with mixed anxiety and depressed mood 03/19/2017   Preventative health care 02/20/2017   Acute cystitis with hematuria 07/06/2014   Anxiety 05/11/2012   SYMPTOM, INSOMNIA NOS 06/08/2007    Past Surgical History:  Procedure Laterality Date   CESAREAN SECTION     2010   EYE SURGERY  12/25/2016   Lasix Eye Surgery    OB History    Gravida  1   Para      Term      Preterm      AB      Living  1     SAB      IAB      Ectopic      Multiple      Live Births               Home Medications    Prior to Admission medications   Medication Sig Start Date End Date Taking? Authorizing Provider  escitalopram (LEXAPRO) 20 MG tablet Take 1 tablet (20 mg total) by mouth daily. 08/06/20  Yes Roma Schanz R, DO  SUMAtriptan (IMITREX) 50 MG tablet Take 1 tab at onset of migraine.  May repeat in 2 hrs, if needed.  Max dose: 2 tabs/day. #12/30. 05/09/20  Yes Ann Held, DO  cyclobenzaprine (FLEXERIL) 5 MG tablet Take 1 tablet (5 mg total) by mouth 2 (two) times  daily as needed for muscle spasms. 09/15/20   Rosemarie Ax, MD  methylphenidate (CONCERTA) 54 MG PO CR tablet Take 1 tablet (54 mg total) by mouth every morning. 05/20/20   Carollee Herter, Alferd Apa, DO  traZODone (DESYREL) 50 MG tablet Take 0.5-1 tablets (25-50 mg total) by mouth at bedtime as needed for sleep. 12/26/18 09/15/20  Ann Held, DO    Family History Family History  Problem Relation Age of Onset   Hypertension Father    Diabetes Father        Type 2   Hyperlipidemia Father    Heart disease Paternal Grandmother    Pancreatic cancer Paternal Grandfather     Social History Social History   Tobacco Use   Smoking status: Never Smoker   Smokeless tobacco: Never Used  Scientific laboratory technician Use: Never used  Substance Use Topics   Alcohol use: No   Drug use: No     Allergies   Sulfonamide derivatives   Review  of Systems Review of Systems  See HPI  Physical Exam Triage Vital Signs ED Triage Vitals  Enc Vitals Group     BP 09/15/20 1206 120/87     Pulse Rate 09/15/20 1206 86     Resp 09/15/20 1206 18     Temp 09/15/20 1206 98.2 F (36.8 C)     Temp Source 09/15/20 1206 Oral     SpO2 09/15/20 1206 98 %     Weight --      Height --      Head Circumference --      Peak Flow --      Pain Score 09/15/20 1207 7     Pain Loc --      Pain Edu? --      Excl. in [place]? --    No data found.  Updated Vital Signs BP 120/87 (BP Location: Left Arm)    Pulse 86    Temp 98.2 F (36.8 C) (Oral)    Resp 18    LMP 08/28/2020 (Approximate)    SpO2 98%   Visual Acuity Right Eye Distance:   Left Eye Distance:   Bilateral Distance:    Right Eye Near:   Left Eye Near:    Bilateral Near:     Physical Exam Gen: NAD, alert, cooperative with exam, well-appearing ENT: normal lips, normal nasal mucosa,  Eye: normal EOM, normal conjunctiva and lids Neuro: normal tone, normal sensation to touch Psych:  normal insight, alert and oriented MSK:   Back: Limited flexion and extension. Tenderness to palpation of the SI joints. Normal strength with hip flexion. Negative straight leg raise. Normal ambulation. Neurovascularly intact   UC Treatments / Results  Labs (all labs ordered are listed, but only abnormal results are displayed) Labs Reviewed - No data to display  EKG   Radiology No results found.  Procedures Procedures (including critical care time)  Medications Ordered in UC Medications  methylPREDNISolone acetate (DEPO-MEDROL) injection 40 mg (40 mg Intramuscular Given 09/15/20 1236)    Initial Impression / Assessment and Plan / UC Course  I have reviewed the triage vital signs and the nursing notes.  Pertinent labs & imaging results that were available during my care of the patient were reviewed by me and considered in my medical decision making (see chart for details).     [name] is a 39 year old female is presenting with low back pain likely related to spasm.  She does have pain over the SI joints.  Provided with intramuscular Depo injection.  Counseled on home exercise therapy and supportive care.  Provided work note.  Provided Flexeril.  Given indications on follow-up.  Final Clinical Impressions(s) / UC Diagnoses   Final diagnoses:  Acute bilateral low back pain without sciatica     Discharge Instructions     Please try heat  Please try range of motion of movements.  Please try lidocaine patches  The muscle relaxer may make you sleepy  Please follow up if your symptoms fail to improve.     ED Prescriptions    Medication Sig Dispense Auth. Provider   cyclobenzaprine (FLEXERIL) 5 MG tablet Take 1 tablet (5 mg total) by mouth 2 (two) times daily as needed for muscle spasms. 20 tablet Rosemarie Ax, MD     PDMP not reviewed this encounter.   Rosemarie Ax, MD 09/15/20 [phone]

## 2020-09-20 DIAGNOSIS — M9903 Segmental and somatic dysfunction of lumbar region: Secondary | ICD-10-CM | POA: Diagnosis not present

## 2020-09-20 DIAGNOSIS — M9905 Segmental and somatic dysfunction of pelvic region: Secondary | ICD-10-CM | POA: Diagnosis not present

## 2020-09-20 DIAGNOSIS — M5451 Vertebrogenic low back pain: Secondary | ICD-10-CM | POA: Diagnosis not present

## 2020-09-20 DIAGNOSIS — M9902 Segmental and somatic dysfunction of thoracic region: Secondary | ICD-10-CM | POA: Diagnosis not present

## 2020-09-25 DIAGNOSIS — M9905 Segmental and somatic dysfunction of pelvic region: Secondary | ICD-10-CM | POA: Diagnosis not present

## 2020-09-25 DIAGNOSIS — M5451 Vertebrogenic low back pain: Secondary | ICD-10-CM | POA: Diagnosis not present

## 2020-09-25 DIAGNOSIS — M9902 Segmental and somatic dysfunction of thoracic region: Secondary | ICD-10-CM | POA: Diagnosis not present

## 2020-09-25 DIAGNOSIS — M9903 Segmental and somatic dysfunction of lumbar region: Secondary | ICD-10-CM | POA: Diagnosis not present

## 2020-12-04 MED FILL — ESCITALOPRAM 20 MG TABLET: 20 | 90 days supply | Qty: 90 | Fill #1

## 2021-03-09 MED FILL — Escitalopram Oxalate Tab 20 MG (Base Equiv): ORAL | 90 days supply | Qty: 90 | Fill #0 | Status: CN

## 2021-03-10 ENCOUNTER — Other Ambulatory Visit (HOSPITAL_COMMUNITY): Payer: Self-pay

## 2021-03-18 ENCOUNTER — Other Ambulatory Visit (HOSPITAL_COMMUNITY): Payer: Self-pay

## 2021-03-18 MED FILL — Tranexamic Acid Tab 650 MG: ORAL | 5 days supply | Qty: 30 | Fill #0 | Status: CN

## 2021-03-20 ENCOUNTER — Other Ambulatory Visit (HOSPITAL_COMMUNITY): Payer: Self-pay

## 2021-03-20 MED FILL — Escitalopram Oxalate Tab 20 MG (Base Equiv): ORAL | 90 days supply | Qty: 90 | Fill #0 | Status: AC

## 2021-05-05 ENCOUNTER — Other Ambulatory Visit (HOSPITAL_COMMUNITY): Payer: Self-pay

## 2021-05-05 ENCOUNTER — Other Ambulatory Visit: Payer: Self-pay | Admitting: Family Medicine

## 2021-05-05 DIAGNOSIS — Z6841 Body Mass Index (BMI) 40.0 and over, adult: Secondary | ICD-10-CM

## 2021-05-05 DIAGNOSIS — F988 Other specified behavioral and emotional disorders with onset usually occurring in childhood and adolescence: Secondary | ICD-10-CM

## 2021-05-12 ENCOUNTER — Ambulatory Visit (INDEPENDENT_AMBULATORY_CARE_PROVIDER_SITE_OTHER): Payer: 59 | Admitting: Family Medicine

## 2021-05-12 ENCOUNTER — Other Ambulatory Visit (HOSPITAL_COMMUNITY): Payer: Self-pay

## 2021-05-12 ENCOUNTER — Other Ambulatory Visit: Payer: Self-pay

## 2021-05-12 ENCOUNTER — Encounter: Payer: Self-pay | Admitting: Family Medicine

## 2021-05-12 VITALS — BP 110/80 | HR 97 | Temp 98.1°F | Resp 18 | Ht 66.0 in | Wt 200.2 lb

## 2021-05-12 DIAGNOSIS — Z0000 Encounter for general adult medical examination without abnormal findings: Secondary | ICD-10-CM

## 2021-05-12 DIAGNOSIS — F4321 Adjustment disorder with depressed mood: Secondary | ICD-10-CM | POA: Diagnosis not present

## 2021-05-12 DIAGNOSIS — Z79899 Other long term (current) drug therapy: Secondary | ICD-10-CM

## 2021-05-12 DIAGNOSIS — F418 Other specified anxiety disorders: Secondary | ICD-10-CM | POA: Diagnosis not present

## 2021-05-12 LAB — CBC WITH DIFFERENTIAL/PLATELET
Basophils Absolute: 0 10*3/uL (ref 0.0–0.1)
Basophils Relative: 0.7 % (ref 0.0–3.0)
Eosinophils Absolute: 0.1 10*3/uL (ref 0.0–0.7)
Eosinophils Relative: 1.4 % (ref 0.0–5.0)
HCT: 32.5 % — ABNORMAL LOW (ref 36.0–46.0)
Hemoglobin: 10.4 g/dL — ABNORMAL LOW (ref 12.0–15.0)
Lymphocytes Relative: 25.6 % (ref 12.0–46.0)
Lymphs Abs: 1.2 10*3/uL (ref 0.7–4.0)
MCHC: 31.9 g/dL (ref 30.0–36.0)
MCV: 78.8 fl (ref 78.0–100.0)
Monocytes Absolute: 0.3 10*3/uL (ref 0.1–1.0)
Monocytes Relative: 7.2 % (ref 3.0–12.0)
Neutro Abs: 3 10*3/uL (ref 1.4–7.7)
Neutrophils Relative %: 65.1 % (ref 43.0–77.0)
Platelets: 403 10*3/uL — ABNORMAL HIGH (ref 150.0–400.0)
RBC: 4.12 Mil/uL (ref 3.87–5.11)
RDW: 16.3 % — ABNORMAL HIGH (ref 11.5–15.5)
WBC: 4.6 10*3/uL (ref 4.0–10.5)

## 2021-05-12 LAB — COMPREHENSIVE METABOLIC PANEL
ALT: 16 U/L (ref 0–35)
AST: 31 U/L (ref 0–37)
Albumin: 4.3 g/dL (ref 3.5–5.2)
Alkaline Phosphatase: 69 U/L (ref 39–117)
BUN: 18 mg/dL (ref 6–23)
CO2: 24 mEq/L (ref 19–32)
Calcium: 9.2 mg/dL (ref 8.4–10.5)
Chloride: 105 mEq/L (ref 96–112)
Creatinine, Ser: 1.03 mg/dL (ref 0.40–1.20)
GFR: 68.57 mL/min (ref >60.00)
Glucose, Bld: 87 mg/dL (ref 70–99)
Potassium: 3.9 mEq/L (ref 3.5–5.1)
Sodium: 140 mEq/L (ref 135–145)
Total Bilirubin: 0.4 mg/dL (ref 0.2–1.2)
Total Protein: 6.7 g/dL (ref 6.0–8.3)

## 2021-05-12 LAB — TSH: TSH: 1.62 u[IU]/mL (ref 0.35–5.50)

## 2021-05-12 LAB — LIPID PANEL
Cholesterol: 159 mg/dL (ref 0–200)
HDL: 52.4 mg/dL (ref >39.00)
LDL Cholesterol: 75 mg/dL (ref 0–99)
NonHDL: 106.2
Total CHOL/HDL Ratio: 3
Triglycerides: 155 mg/dL — ABNORMAL HIGH (ref 0.0–149.0)
VLDL: 31 mg/dL (ref 0.0–40.0)

## 2021-05-12 MED ORDER — LORAZEPAM 0.5 MG PO TABS
0.5000 mg | ORAL_TABLET | Freq: Two times a day (BID) | ORAL | 1 refills | Status: DC | PRN
  Filled 2021-05-12: qty 30, 15d supply, fill #0

## 2021-05-12 NOTE — Assessment & Plan Note (Signed)
con't lexapro Ativan for prn use  F/u 1 month or sooner prn

## 2021-05-12 NOTE — Assessment & Plan Note (Signed)
Pt will have labs today and rto for cpe

## 2021-05-12 NOTE — Patient Instructions (Signed)
Major Depressive Disorder, Adult Major depressive disorder (MDD) is a mental health condition. It may also be called clinical depression or unipolar depression. MDD causes symptoms of sadness, hopelessness, and loss of interest in things. These symptoms last most of the day, almost every day, for 2 weeks. MDD can also cause physical symptoms. It can interfere with relationships and with everyday activities,such as work, school, and activities that are usually pleasant. MDD may be mild, moderate, or severe. It may be single-episode MDD, whichhappens once, or recurrent MDD, which may occur multiple times. What are the causes? The exact cause of this condition is not known. MDD is most likely caused by a combination of things, which may include: Your personality traits. Learned or conditioned behaviors or thoughts or feelings that reinforce negativity. Any alcohol or substance misuse. Long-term (chronic) physical or mental health illness. Going through a traumatic experience or major life changes. What increases the risk? The following factors may make someone more likely to develop MDD: A family history of depression. Being a woman. Troubled family relationships. Abnormally low levels of certain brain chemicals. Traumatic or painful events in childhood, especially abuse or loss of a parent. A lot of stress from life experiences, such as poor living conditions or discrimination. Chronic physical illness or other mental health disorders. What are the signs or symptoms? The main symptoms of MDD usually include: Constant depressed or irritable mood. A loss of interest in things and activities. Other symptoms include: Sleeping or eating too much or too little. Unexplained weight gain or weight loss. Tiredness or low energy. Being agitated, restless, or weak. Feeling hopeless, worthless, or guilty. Trouble thinking clearly or making decisions. Thoughts of suicide or thoughts of harming  others. Isolating oneself or avoiding other people or activities. Trouble completing tasks, work, or any normal obligations. Severe symptoms of this condition may include: Psychotic depression.This may include false beliefs, or delusions. It may also include seeing, hearing, tasting, smelling, or feeling things that are not real (hallucinations). Chronic depression or persistent depressive disorder. This is low-level depression that lasts for at least 2 years. Melancholic depression, or feeling extremely sad and hopeless. Catatonic depression, which includes trouble speaking and trouble moving. How is this diagnosed? This condition may be diagnosed based on: Your symptoms. Your medical and mental health history. You may be asked questions about your lifestyle, including any drug and alcohol use. A physical exam. Blood tests to rule out other conditions. MDD is confirmed if you have the following symptoms most of the day, nearly every day, in a 2-week period: Either a depressed mood or loss of interest. At least four other MDD symptoms. How is this treated? This condition is usually treated by mental health professionals, such as psychologists, psychiatrists, and clinical social workers. You may need more than one type of treatment. Treatment may include: Psychotherapy, also called talk therapy or counseling. Types of psychotherapy include: Cognitive behavioral therapy (CBT). This teaches you to recognize unhealthy feelings, thoughts, and behaviors, and replace them with positive thoughts and actions. Interpersonal therapy (IPT). This helps you to improve the way you communicate with others or relate to them. Family therapy. This treatment includes members of your family. Medicines to treat anxiety and depression. These medicines help to balance the brain chemicals that affect your emotions. Lifestyle changes. You may be asked to: Limit alcohol use and avoid drug use. Get regular  exercise. Get plenty of sleep. Make healthy eating choices. Spend more time outdoors. Brain stimulation. This may be done   if symptoms are very severe and other treatments have not worked. Examples of this treatment are electroconvulsive therapy and transcranial magnetic stimulation. Follow these instructions at home: Activity Exercise regularly and spend time outdoors. Find activities that you enjoy doing, and make time to do them. Find healthy ways to manage stress, such as: Meditation or deep breathing. Spending time in nature. Journaling. Return to your normal activities as told by your health care provider. Ask your health care provider what activities are safe for you. Alcohol and drug use If you drink alcohol: Limit how much you use to: 0-1 drink a day for women who are not pregnant. 0-2 drinks a day for men. Be aware of how much alcohol is in your drink. In the [place] one drink equals one 12 oz bottle of beer (355 mL), one 5 oz glass of wine (148 mL), or one 1 oz glass of hard liquor (44 mL). Discuss your alcohol use with your health care provider. Alcohol can affect any antidepressant medicines you are taking. Discuss any drug use with your health care provider. General instructions  Take over-the-counter and prescription medicines only as told by your health care provider. Eat a healthy diet and get plenty of sleep. Consider joining a support group. Your health care provider may be able to recommend one. Keep all follow-up visits as told by your health care provider. This is important.  Where to find more information National Alliance on Mental Illness: www.nami.org [place] National Institute of Mental Health: www.nimh.nih.gov Contact a health care provider if: Your symptoms get worse. You develop new symptoms. Get help right away if: You self-harm. You have serious thoughts about hurting yourself or others. You hallucinate. If you ever feel like you may hurt yourself or  others, or have thoughts about taking your own life, get help right away. Go to your nearest emergency department or: Call your local emergency services (911 in the [place] Call a suicide crisis helpline, such as the National Suicide Prevention Lifeline at 1-800-273-8255. This is open 24 hours a day in the [place] Text the Crisis Text Line at 741741 (in the [place] Summary Major depressive disorder (MDD) is a mental health condition. MDD causes symptoms of sadness, hopelessness, and loss of interest in things. These symptoms last most of the day, almost every day, for 2 weeks. The symptoms of MDD can interfere with relationships and with everyday activities. Treatments and support are available for people who develop MDD. You may need more than one type of treatment. Get help right away if you have serious thoughts about hurting yourself or others. This information is not intended to replace advice given to you by your health care provider. Make sure you discuss any questions you have with your healthcare provider. Document Revised: 08/26/2019 Document Reviewed: 08/26/2019 Elsevier Patient Education  2022 Elsevier Inc.  

## 2021-05-12 NOTE — Progress Notes (Signed)
Established Patient Office Visit  Subjective:  Patient ID: [name], female    DOB: Jun 07, 1981  Age: 40 y.o. MRN: [id number]  CC:  Chief Complaint  Patient presents with   Anxiety   Depression    HPI [name] presents for anxiety and depression ---her husband suddenly passed away last week.  They still do not know what happened.   She is not sleeping.  They had separated previously   Past Medical History:  Diagnosis Date   ADHD    Anemia    Anxiety    Anxiety and depression    History of gestational hypertension    Leukopenia    Pre-eclampsia    2010    Past Surgical History:  Procedure Laterality Date   CESAREAN SECTION     2010   EYE SURGERY  12/25/2016   [facility]    Family History  Problem Relation Age of Onset   Hypertension Father    Diabetes Father        Type 2   Hyperlipidemia Father    Heart disease Paternal Grandmother    Pancreatic cancer Paternal Grandfather     Social History   Socioeconomic History   Marital status: Widowed    Spouse name: Audean Karpinski   Number of children: 1   Years of education: Not on file   Highest education level: Master's degree (e.g., MA, MS, MEng, MEd, MSW, MBA)  Occupational History   Occupation: Programmer, multimedia: [employer]    Comment: nurse, Neuro ICU  Tobacco Use   Smoking status: Never   Smokeless tobacco: Never  Vaping Use   Vaping Use: Never used  Substance and Sexual Activity   Alcohol use: No   Drug use: No   Sexual activity: Yes    Partners: Male  Other Topics Concern   Not on file  Social History Narrative   Not on file   Social Determinants of Health   Financial Resource Strain: Not on file  Food Insecurity: Not on file  Transportation Needs: Not on file  Physical Activity: Not on file  Stress: Not on file  Social Connections: Not on file  Intimate Partner Violence: Not on file    Outpatient Medications Prior to Visit  Medication Sig Dispense Refill    escitalopram (LEXAPRO) 20 MG tablet TAKE 1 TABLET (20 MG TOTAL) BY MOUTH DAILY. 90 tablet 3   methylphenidate (CONCERTA) 54 MG PO CR tablet Take 1 tablet (54 mg total) by mouth every morning. 30 tablet 0   SUMAtriptan (IMITREX) 50 MG tablet Take 1 tab at onset of migraine.  May repeat in 2 hrs, if needed.  Max dose: 2 tabs/day. #12/30. 12 tablet 5   cyclobenzaprine (FLEXERIL) 5 MG tablet Take 1 tablet (5 mg total) by mouth 2 (two) times daily as needed for muscle spasms. 20 tablet 0   tranexamic acid (LYSTEDA) 650 MG TABS tablet TAKE 2 TABLETS BY MOUTH THREE TIMES DAILY AS NEEDED 30 tablet 2   No facility-administered medications prior to visit.    Allergies  Allergen Reactions   Sulfonamide Derivatives Swelling and Rash    ROS Review of Systems  Constitutional:  Negative for activity change, appetite change, fatigue and unexpected weight change.  Respiratory:  Negative for cough and shortness of breath.   Cardiovascular:  Negative for chest pain and palpitations.  Psychiatric/Behavioral:  Positive for decreased concentration, dysphoric mood and sleep disturbance. Negative for behavioral problems, self-injury and  suicidal ideas. The patient is nervous/anxious.      Objective:    Physical Exam Vitals and nursing note reviewed.  Constitutional:      Appearance: She is well-developed.  HENT:     Head: Normocephalic and atraumatic.  Eyes:     Conjunctiva/sclera: Conjunctivae normal.  Neck:     Thyroid: No thyromegaly.     Vascular: No carotid bruit or JVD.  Cardiovascular:     Rate and Rhythm: Normal rate and regular rhythm.     Heart sounds: Normal heart sounds. No murmur heard. Pulmonary:     Effort: Pulmonary effort is normal. No respiratory distress.     Breath sounds: Normal breath sounds. No wheezing or rales.  Chest:     Chest wall: No tenderness.  Musculoskeletal:     Cervical back: Normal range of motion and neck supple.  Neurological:     Mental Status: She is  alert and oriented to person, place, and time.  Psychiatric:        Attention and Perception: Attention normal.        Mood and Affect: Affect normal. Mood is anxious and depressed. Mood is not elated. Affect is not labile, blunt, flat, angry, tearful or inappropriate.        Speech: Speech normal.        Behavior: Behavior normal.        Thought Content: Thought content is not paranoid or delusional. Thought content does not include homicidal or suicidal ideation. Thought content does not include homicidal or suicidal plan.        Cognition and Memory: Cognition and memory normal.        Judgment: Judgment normal.   BP 110/80 (BP Location: Left Arm, Patient Position: Sitting, Cuff Size: Normal)   Pulse 97   Temp 98.1 F (36.7 C) (Oral)   Resp 18   Ht '5\' 6"'$  (1.676 m)   Wt 200 lb 3.2 oz (90.8 kg)   SpO2 98%   BMI 32.31 kg/m  Wt Readings from Last 3 Encounters:  05/12/21 200 lb 3.2 oz (90.8 kg)  05/09/20 210 lb 12.8 oz (95.6 kg)  04/10/19 200 lb (90.7 kg)     Health Maintenance Due  Topic Date Due   COVID-19 Vaccine (1) Never done   Pneumococcal Vaccine 70-9 Years old (1 - PCV) Never done   HIV Screening  Never done   Hepatitis C Screening  Never done   INFLUENZA VACCINE  04/28/2021    There are no preventive care reminders to display for this patient.  Lab Results  Component Value Date   TSH 2.680 06/16/2018   Lab Results  Component Value Date   WBC 5.4 04/07/2018   HGB 12.8 04/07/2018   HCT 38.3 04/07/2018   MCV 89.8 04/07/2018   PLT 380.0 04/07/2018   Lab Results  Component Value Date   NA 139 06/16/2018   K 4.4 06/16/2018   CO2 21 06/16/2018   GLUCOSE 73 06/16/2018   BUN 10 06/16/2018   CREATININE 0.84 06/16/2018   BILITOT 0.5 06/16/2018   ALKPHOS 74 06/16/2018   AST 44 (H) 06/16/2018   ALT 68 (H) 06/16/2018   PROT 6.8 06/16/2018   ALBUMIN 4.4 06/16/2018   CALCIUM 9.5 06/16/2018   GFR 78.20 04/07/2018   Lab Results  Component Value Date   CHOL  196 06/16/2018   Lab Results  Component Value Date   HDL 64 06/16/2018   Lab Results  Component Value Date  LDLCALC 104 (H) 06/16/2018   Lab Results  Component Value Date   TRIG 139 06/16/2018   Lab Results  Component Value Date   CHOLHDL 3 04/07/2018   Lab Results  Component Value Date   HGBA1C 5.4 06/16/2018      Assessment & Plan:   Problem List Items Addressed This Visit       Unprioritized   Depression with anxiety - Primary    con't lexapro Ativan for prn use  F/u 1 month or sooner prn       Relevant Medications   LORazepam (ATIVAN) 0.5 MG tablet   Grief   Preventative health care    Pt will have labs today and rto for cpe      Relevant Orders   Lipid panel   CBC with Differential/Platelet   TSH   Comprehensive metabolic panel   Other Visit Diagnoses     High risk medication use       Relevant Orders   DRUG MONITORING PANEL [id number] , URINE       Meds ordered this encounter  Medications   LORazepam (ATIVAN) 0.5 MG tablet    Sig: Take 1 tablet (0.5 mg total) by mouth 2 (two) times daily as needed for anxiety.    Dispense:  30 tablet    Refill:  1     Follow-up: Return in about 4 weeks (around 06/09/2021), or if symptoms worsen or fail to improve, for f/u anxiety and reschedule cpe --- labs done today.    Ann Held, DO

## 2021-05-13 ENCOUNTER — Other Ambulatory Visit: Payer: Self-pay | Admitting: Family Medicine

## 2021-05-13 DIAGNOSIS — D649 Anemia, unspecified: Secondary | ICD-10-CM

## 2021-05-15 LAB — DRUG MONITORING PANEL 375977 , URINE

## 2021-05-15 LAB — DM TEMPLATE

## 2021-06-04 ENCOUNTER — Other Ambulatory Visit: Payer: Self-pay | Admitting: Family Medicine

## 2021-06-04 DIAGNOSIS — G43819 Other migraine, intractable, without status migrainosus: Secondary | ICD-10-CM

## 2021-06-10 ENCOUNTER — Ambulatory Visit: Payer: 59 | Admitting: Family Medicine

## 2021-06-16 ENCOUNTER — Telehealth: Payer: Self-pay

## 2021-06-16 ENCOUNTER — Ambulatory Visit: Payer: 59 | Admitting: Family Medicine

## 2021-06-16 NOTE — Telephone Encounter (Signed)
[facility] - Client Client Site [facility] - Night Physician Roma Schanz- MD Contact Type Call Who Is Calling Patient / Member / Family / Caregiver Caller Name Kloi Baz Caller Phone Number [phone] Patient Name [name] Patient DOB [birth year] Call Type Message Only Information Provided Reason for Call Request to Reschedule Office Appointment Initial Comment Caller states she has an appointment scheduled today and she has a positive covid test. She is calling to reschedule. Patient request to speak to RN No Additional Comment Nurse triage was offered and caller declined. Provided office hours. Disp. Time Disposition Final User 06/16/2021 7:51:54 AM General Information Provided Yes [place] Call Closed By: Shireen Quan Transaction Date/Time: 06/16/2021 7:49:46 AM (ET)

## 2021-06-23 DIAGNOSIS — H52223 Regular astigmatism, bilateral: Secondary | ICD-10-CM | POA: Diagnosis not present

## 2021-07-04 ENCOUNTER — Other Ambulatory Visit (HOSPITAL_COMMUNITY): Payer: Self-pay

## 2021-07-04 MED ORDER — SUMATRIPTAN SUCCINATE 50 MG PO TABS
50.0000 mg | ORAL_TABLET | ORAL | 5 refills | Status: DC
  Filled 2021-07-04: qty 12, 30d supply, fill #0
  Filled 2021-10-02: qty 12, 30d supply, fill #1

## 2021-07-04 MED FILL — Escitalopram Oxalate Tab 20 MG (Base Equiv): ORAL | 90 days supply | Qty: 90 | Fill #1 | Status: AC

## 2021-07-09 DIAGNOSIS — R070 Pain in throat: Secondary | ICD-10-CM | POA: Diagnosis not present

## 2021-07-09 DIAGNOSIS — J020 Streptococcal pharyngitis: Secondary | ICD-10-CM | POA: Diagnosis not present

## 2021-08-20 DIAGNOSIS — J069 Acute upper respiratory infection, unspecified: Secondary | ICD-10-CM | POA: Diagnosis not present

## 2021-09-10 ENCOUNTER — Telehealth: Payer: Self-pay | Admitting: Family Medicine

## 2021-09-10 NOTE — Telephone Encounter (Signed)
Patient has an appointment coming up on 10/13/21  Requesting: methylphenidate 54mg  Contract:  UDS: 05/12/21 Last Visit: 05/12/21 Next Visit: 10/13/21 Last Refill: 05/20/20 ?  Please Advise

## 2021-09-10 NOTE — Telephone Encounter (Signed)
Pt wants to see can se get a refill to last her to her appointment on 1/16:  Medication: methylphenidate (CONCERTA) 54 MG PO CR tablet   Has the patient contacted their pharmacy? No. (If no, request that the patient contact the pharmacy for the refill.) (If yes, when and what did the pharmacy advise?)  Preferred Pharmacy (with phone number or street name):  [facility]  [address] [place] [address]  Phone:  [phone]  Fax:  [phone]   Agent: Please be advised that RX refills may take up to 3 business days. We ask that you follow-up with your pharmacy.

## 2021-09-11 ENCOUNTER — Other Ambulatory Visit: Payer: Self-pay | Admitting: Family Medicine

## 2021-09-11 DIAGNOSIS — Z6841 Body Mass Index (BMI) 40.0 and over, adult: Secondary | ICD-10-CM

## 2021-09-11 DIAGNOSIS — F988 Other specified behavioral and emotional disorders with onset usually occurring in childhood and adolescence: Secondary | ICD-10-CM

## 2021-09-11 MED ORDER — METHYLPHENIDATE HCL ER (OSM) 54 MG PO TBCR: 54.0000 mg | EXTENDED_RELEASE_TABLET | ORAL | 0 refills | Status: DC

## 2021-09-11 NOTE — Telephone Encounter (Signed)
Error

## 2021-09-30 ENCOUNTER — Other Ambulatory Visit: Payer: Self-pay

## 2021-09-30 ENCOUNTER — Other Ambulatory Visit: Payer: Self-pay | Admitting: Family Medicine

## 2021-09-30 DIAGNOSIS — F419 Anxiety disorder, unspecified: Secondary | ICD-10-CM

## 2021-09-30 MED ORDER — ESCITALOPRAM OXALATE 20 MG PO TABS
ORAL_TABLET | Freq: Every day | ORAL | 0 refills | Status: DC
Start: 2021-09-30 — End: 2022-11-13
  Filled 2021-09-30 – 2021-10-02 (×2): qty 90, fill #0
  Filled 2021-10-03: qty 90, 90d supply, fill #0

## 2021-10-03 ENCOUNTER — Other Ambulatory Visit: Payer: Self-pay

## 2021-10-03 ENCOUNTER — Other Ambulatory Visit (HOSPITAL_COMMUNITY): Payer: Self-pay

## 2021-10-13 ENCOUNTER — Encounter: Payer: 59 | Admitting: Family Medicine

## 2022-04-09 ENCOUNTER — Other Ambulatory Visit (HOSPITAL_COMMUNITY): Payer: Self-pay

## 2022-04-09 MED ORDER — NITROFURANTOIN MONOHYD MACRO 100 MG PO CAPS
100.0000 mg | ORAL_CAPSULE | Freq: Two times a day (BID) | ORAL | 0 refills | Status: DC
  Filled 2022-04-09: qty 10, 5d supply, fill #0

## 2022-04-09 MED ORDER — PHENAZOPYRIDINE HCL 100 MG PO TABS
100.0000 mg | ORAL_TABLET | Freq: Three times a day (TID) | ORAL | 0 refills | Status: DC | PRN
  Filled 2022-04-09: qty 6, 2d supply, fill #0

## 2022-04-26 ENCOUNTER — Telehealth: Payer: 59 | Admitting: Family

## 2022-04-26 DIAGNOSIS — R399 Unspecified symptoms and signs involving the genitourinary system: Secondary | ICD-10-CM | POA: Diagnosis not present

## 2022-04-26 MED ORDER — CEPHALEXIN 500 MG PO CAPS: 500.0000 mg | ORAL_CAPSULE | Freq: Two times a day (BID) | ORAL | 0 refills | Status: DC

## 2022-04-26 NOTE — Progress Notes (Signed)
Virtual Visit Consent   [name], you are scheduled for a virtual visit with a [facility] provider today. Just as with appointments in the office, your consent must be obtained to participate. Your consent will be active for this visit and any virtual visit you may have with one of our providers in the next 365 days. If you have a MyChart account, a copy of this consent can be sent to you electronically.  As this is a virtual visit, video technology does not allow for your provider to perform a traditional examination. This may limit your provider's ability to fully assess your condition. If your provider identifies any concerns that need to be evaluated in person or the need to arrange testing (such as labs, EKG, etc.), we will make arrangements to do so. Although advances in technology are sophisticated, we cannot ensure that it will always work on either your end or our end. If the connection with a video visit is poor, the visit may have to be switched to a telephone visit. With either a video or telephone visit, we are not always able to ensure that we have a secure connection.  By engaging in this virtual visit, you consent to the provision of healthcare and authorize for your insurance to be billed (if applicable) for the services provided during this visit. Depending on your insurance coverage, you may receive a charge related to this service.  I need to obtain your verbal consent now. Are you willing to proceed with your visit today? [name] has provided verbal consent on 04/26/2022 for a virtual visit (video or telephone). Evelina Dun, FNP  Date: 04/26/2022 7:13 PM  Virtual Visit via Video Note   I, Evelina Dun, connected with  [name]  ([id number], 08-11-1981) on 04/26/22 at  7:15 PM EDT by a video-enabled telemedicine application and verified that I am speaking with the correct person using two identifiers.  Location: Patient: Virtual Visit  Location Patient: Home Provider: Virtual Visit Location Provider: Home Office   I discussed the limitations of evaluation and management by telemedicine and the availability of in person appointments. The patient expressed understanding and agreed to proceed.    History of Present Illness: [name] is a 41 y.o. who identifies as a female who was assigned female at birth, and is being seen today for UTI symptoms that started last week. She completed a 5 days of macrobid with mild relief.   HPI: Urinary Frequency  This is a new problem. The current episode started 1 to 4 weeks ago. The problem occurs intermittently. The problem has been waxing and waning. The quality of the pain is described as burning. The pain is at a severity of 5/10. The pain is mild. There has been no fever. Associated symptoms include frequency and urgency. Pertinent negatives include no flank pain, hematuria, hesitancy, nausea or vomiting. She has tried increased fluids (macrobid) for the symptoms.    Problems:  Patient Active Problem List   Diagnosis Date Noted   Depression with anxiety 05/12/2021   Grief 05/12/2021   Close exposure to COVID-19 virus 04/10/2019   Chronic migraine 07/12/2018   Attention deficit disorder 03/19/2017   Adjustment disorder with mixed anxiety and depressed mood 03/19/2017   Preventative health care 02/20/2017   Acute cystitis with hematuria 07/06/2014   Anxiety 05/11/2012   SYMPTOM, INSOMNIA NOS 06/08/2007    Allergies:  Allergies  Allergen Reactions   Sulfonamide Derivatives Swelling and Rash  Medications:  Current Outpatient Medications:    cephALEXin (KEFLEX) 500 MG capsule, Take 1 capsule (500 mg total) by mouth 2 (two) times daily., Disp: 14 capsule, Rfl: 0   escitalopram (LEXAPRO) 20 MG tablet, TAKE 1 TABLET (20 MG TOTAL) BY MOUTH DAILY., Disp: 90 tablet, Rfl: 0   LORazepam (ATIVAN) 0.5 MG tablet, Take 1 tablet (0.5 mg total) by mouth 2 (two) times daily as needed  for anxiety., Disp: 30 tablet, Rfl: 1   methylphenidate (CONCERTA) 54 MG PO CR tablet, Take 1 tablet (54 mg total) by mouth every morning., Disp: 30 tablet, Rfl: 0   nitrofurantoin, macrocrystal-monohydrate, (MACROBID) 100 MG capsule, Take 1 capsule (100 mg total) by mouth 2 (two) times daily for 5 days, Disp: 10 capsule, Rfl: 0   phenazopyridine (PYRIDIUM) 100 MG tablet, Take 1 tablet (100 mg total) by mouth every 8 (eight) hours as needed for 2 days, Disp: 6 tablet, Rfl: 0   SUMAtriptan (IMITREX) 50 MG tablet, TAKE 1 TAB AT ONSET OF MIGRAINE. MAY REPEAT IN 2 HRS, IF NEEDED. MAX DOSE: 2 TABS/DAY. #12/30., Disp: 12 tablet, Rfl: 5   SUMAtriptan (IMITREX) 50 MG tablet, Take 1 tablet (50 mg total) by mouth at onset of migraine. May repeat in 2 hours if needed. Max  of 2 tablets/24 hours, Disp: 12 tablet, Rfl: 5  Observations/Objective: Patient is well-developed, well-nourished in no acute distress.  Resting comfortably  at home.  Head is normocephalic, atraumatic.  No labored breathing.  Speech is clear and coherent with logical content.  Patient is alert and oriented at baseline.    Assessment and Plan: 1. UTI symptoms - cephALEXin (KEFLEX) 500 MG capsule; Take 1 capsule (500 mg total) by mouth 2 (two) times daily.  Dispense: 14 capsule; Refill: 0  Force fluids AZO over the counter X2 days If symptoms do not improve or worsens in next few days, needs to be seen in person for urine culture  Follow Up Instructions: I discussed the assessment and treatment plan with the patient. The patient was provided an opportunity to ask questions and all were answered. The patient agreed with the plan and demonstrated an understanding of the instructions.  A copy of instructions were sent to the patient via MyChart unless otherwise noted below.     The patient was advised to call back or seek an in-person evaluation if the symptoms worsen or if the condition fails to improve as anticipated.  Time:  I  spent 6 minutes with the patient via telehealth technology discussing the above problems/concerns.    Evelina Dun, FNP

## 2022-05-14 DIAGNOSIS — Z6827 Body mass index (BMI) 27.0-27.9, adult: Secondary | ICD-10-CM | POA: Diagnosis not present

## 2022-05-14 DIAGNOSIS — Z01419 Encounter for gynecological examination (general) (routine) without abnormal findings: Secondary | ICD-10-CM | POA: Diagnosis not present

## 2022-05-14 DIAGNOSIS — Z1151 Encounter for screening for human papillomavirus (HPV): Secondary | ICD-10-CM | POA: Diagnosis not present

## 2022-05-14 DIAGNOSIS — Z1231 Encounter for screening mammogram for malignant neoplasm of breast: Secondary | ICD-10-CM | POA: Diagnosis not present

## 2022-05-14 DIAGNOSIS — Z124 Encounter for screening for malignant neoplasm of cervix: Secondary | ICD-10-CM | POA: Diagnosis not present

## 2022-05-18 ENCOUNTER — Other Ambulatory Visit: Payer: Self-pay | Admitting: Obstetrics and Gynecology

## 2022-05-18 DIAGNOSIS — R928 Other abnormal and inconclusive findings on diagnostic imaging of breast: Secondary | ICD-10-CM

## 2022-06-08 ENCOUNTER — Ambulatory Visit
Admission: RE | Admit: 2022-06-08 | Discharge: 2022-06-08 | Disposition: A | Discharge status: Home / Self Care | Payer: 59 | Source: Ambulatory Visit | Attending: Obstetrics and Gynecology | Admitting: Obstetrics and Gynecology

## 2022-06-08 ENCOUNTER — Other Ambulatory Visit: Payer: Self-pay | Admitting: Obstetrics and Gynecology

## 2022-06-08 DIAGNOSIS — R928 Other abnormal and inconclusive findings on diagnostic imaging of breast: Secondary | ICD-10-CM

## 2022-06-08 DIAGNOSIS — R922 Inconclusive mammogram: Secondary | ICD-10-CM | POA: Diagnosis not present

## 2022-06-08 DIAGNOSIS — N6310 Unspecified lump in the right breast, unspecified quadrant: Secondary | ICD-10-CM

## 2022-06-08 DIAGNOSIS — N6001 Solitary cyst of right breast: Secondary | ICD-10-CM | POA: Diagnosis not present

## 2022-06-09 ENCOUNTER — Other Ambulatory Visit: Payer: Self-pay | Admitting: Obstetrics and Gynecology

## 2022-06-09 DIAGNOSIS — Z3202 Encounter for pregnancy test, result negative: Secondary | ICD-10-CM | POA: Diagnosis not present

## 2022-06-09 DIAGNOSIS — N6310 Unspecified lump in the right breast, unspecified quadrant: Secondary | ICD-10-CM

## 2022-06-09 DIAGNOSIS — Z30430 Encounter for insertion of intrauterine contraceptive device: Secondary | ICD-10-CM | POA: Diagnosis not present

## 2022-06-17 ENCOUNTER — Ambulatory Visit
Admission: RE | Admit: 2022-06-17 | Discharge: 2022-06-17 | Disposition: A | Discharge status: Home / Self Care | Payer: 59 | Source: Ambulatory Visit | Attending: Obstetrics and Gynecology | Admitting: Obstetrics and Gynecology

## 2022-06-17 DIAGNOSIS — N6310 Unspecified lump in the right breast, unspecified quadrant: Secondary | ICD-10-CM

## 2022-06-17 DIAGNOSIS — N6313 Unspecified lump in the right breast, lower outer quadrant: Secondary | ICD-10-CM | POA: Diagnosis not present

## 2022-06-17 DIAGNOSIS — D241 Benign neoplasm of right breast: Secondary | ICD-10-CM | POA: Diagnosis not present

## 2022-07-23 DIAGNOSIS — Z30431 Encounter for routine checking of intrauterine contraceptive device: Secondary | ICD-10-CM | POA: Diagnosis not present

## 2022-07-27 DIAGNOSIS — H5213 Myopia, bilateral: Secondary | ICD-10-CM | POA: Diagnosis not present

## 2022-07-30 DIAGNOSIS — N83202 Unspecified ovarian cyst, left side: Secondary | ICD-10-CM | POA: Diagnosis not present

## 2022-07-30 DIAGNOSIS — N939 Abnormal uterine and vaginal bleeding, unspecified: Secondary | ICD-10-CM | POA: Diagnosis not present

## 2022-07-30 DIAGNOSIS — Z30431 Encounter for routine checking of intrauterine contraceptive device: Secondary | ICD-10-CM | POA: Diagnosis not present

## 2022-07-30 DIAGNOSIS — D259 Leiomyoma of uterus, unspecified: Secondary | ICD-10-CM | POA: Diagnosis not present

## 2022-09-24 DIAGNOSIS — N83202 Unspecified ovarian cyst, left side: Secondary | ICD-10-CM | POA: Diagnosis not present

## 2022-10-28 ENCOUNTER — Other Ambulatory Visit: Payer: Self-pay | Admitting: Obstetrics and Gynecology

## 2022-10-28 DIAGNOSIS — D241 Benign neoplasm of right breast: Secondary | ICD-10-CM

## 2022-11-13 ENCOUNTER — Ambulatory Visit (INDEPENDENT_AMBULATORY_CARE_PROVIDER_SITE_OTHER): Payer: PRIVATE HEALTH INSURANCE | Admitting: Family Medicine

## 2022-11-13 ENCOUNTER — Encounter: Payer: Self-pay | Admitting: Family Medicine

## 2022-11-13 VITALS — BP 100/78 | HR 82 | Temp 98.3°F | Resp 18 | Ht 66.0 in | Wt 169.2 lb

## 2022-11-13 DIAGNOSIS — F418 Other specified anxiety disorders: Secondary | ICD-10-CM | POA: Diagnosis not present

## 2022-11-13 DIAGNOSIS — G43819 Other migraine, intractable, without status migrainosus: Secondary | ICD-10-CM | POA: Diagnosis not present

## 2022-11-13 DIAGNOSIS — Z0000 Encounter for general adult medical examination without abnormal findings: Secondary | ICD-10-CM

## 2022-11-13 DIAGNOSIS — Z1159 Encounter for screening for other viral diseases: Secondary | ICD-10-CM | POA: Diagnosis not present

## 2022-11-13 MED ORDER — BUPROPION HCL ER (XL) 300 MG PO TB24: ORAL_TABLET | ORAL | 3 refills | Status: DC

## 2022-11-13 MED ORDER — SUMATRIPTAN SUCCINATE 50 MG PO TABS: ORAL_TABLET | ORAL | 5 refills | Status: DC

## 2022-11-13 NOTE — Assessment & Plan Note (Signed)
Ghm utd Check labs  See AVS

## 2022-11-13 NOTE — Patient Instructions (Signed)
Preventive Care 40-41 Years Old, Female Preventive care refers to lifestyle choices and visits with your health care provider that can promote health and wellness. Preventive care visits are also called wellness exams. What can I expect for my preventive care visit? Counseling Your health care provider may ask you questions about your: Medical history, including: Past medical problems. Family medical history. Pregnancy history. Current health, including: Menstrual cycle. Method of birth control. Emotional well-being. Home life and relationship well-being. Sexual activity and sexual health. Lifestyle, including: Alcohol, nicotine or tobacco, and drug use. Access to firearms. Diet, exercise, and sleep habits. Work and work environment. Sunscreen use. Safety issues such as seatbelt and bike helmet use. Physical exam Your health care provider will check your: Height and weight. These may be used to calculate your BMI (body mass index). BMI is a measurement that tells if you are at a healthy weight. Waist circumference. This measures the distance around your waistline. This measurement also tells if you are at a healthy weight and may help predict your risk of certain diseases, such as type 2 diabetes and high blood pressure. Heart rate and blood pressure. Body temperature. Skin for abnormal spots. What immunizations do I need?  Vaccines are usually given at various ages, according to a schedule. Your health care provider will recommend vaccines for you based on your age, medical history, and lifestyle or other factors, such as travel or where you work. What tests do I need? Screening Your health care provider may recommend screening tests for certain conditions. This may include: Lipid and cholesterol levels. Diabetes screening. This is done by checking your blood sugar (glucose) after you have not eaten for a while (fasting). Pelvic exam and Pap test. Hepatitis B test. Hepatitis C  test. HIV (human immunodeficiency virus) test. STI (sexually transmitted infection) testing, if you are at risk. Lung cancer screening. Colorectal cancer screening. Mammogram. Talk with your health care provider about when you should start having regular mammograms. This may depend on whether you have a family history of breast cancer. BRCA-related cancer screening. This may be done if you have a family history of breast, ovarian, tubal, or peritoneal cancers. Bone density scan. This is done to screen for osteoporosis. Talk with your health care provider about your test results, treatment options, and if necessary, the need for more tests. Follow these instructions at home: Eating and drinking  Eat a diet that includes fresh fruits and vegetables, whole grains, lean protein, and low-fat dairy products. Take vitamin and mineral supplements as recommended by your health care provider. Do not drink alcohol if: Your health care provider tells you not to drink. You are pregnant, may be pregnant, or are planning to become pregnant. If you drink alcohol: Limit how much you have to 0-1 drink a day. Know how much alcohol is in your drink. In the [place] one drink equals one 12 oz bottle of beer (355 mL), one 5 oz glass of wine (148 mL), or one 1 oz glass of hard liquor (44 mL). Lifestyle Brush your teeth every morning and night with fluoride toothpaste. Floss one time each day. Exercise for at least 30 minutes 5 or more days each week. Do not use any products that contain nicotine or tobacco. These products include cigarettes, chewing tobacco, and vaping devices, such as e-cigarettes. If you need help quitting, ask your health care provider. Do not use drugs. If you are sexually active, practice safe sex. Use a condom or other form of protection to   prevent STIs. If you do not wish to become pregnant, use a form of birth control. If you plan to become pregnant, see your health care provider for a  prepregnancy visit. Take aspirin only as told by your health care provider. Make sure that you understand how much to take and what form to take. Work with your health care provider to find out whether it is safe and beneficial for you to take aspirin daily. Find healthy ways to manage stress, such as: Meditation, yoga, or listening to music. Journaling. Talking to a trusted person. Spending time with friends and family. Minimize exposure to UV radiation to reduce your risk of skin cancer. Safety Always wear your seat belt while driving or riding in a vehicle. Do not drive: If you have been drinking alcohol. Do not ride with someone who has been drinking. When you are tired or distracted. While texting. If you have been using any mind-altering substances or drugs. Wear a helmet and other protective equipment during sports activities. If you have firearms in your house, make sure you follow all gun safety procedures. Seek help if you have been physically or sexually abused. What's next? Visit your health care provider once a year for an annual wellness visit. Ask your health care provider how often you should have your eyes and teeth checked. Stay up to date on all vaccines. This information is not intended to replace advice given to you by your health care provider. Make sure you discuss any questions you have with your health care provider. Document Revised: 03/12/2021 Document Reviewed: 03/12/2021 Elsevier Patient Education  2023 Elsevier Inc.  

## 2022-11-13 NOTE — Progress Notes (Signed)
Subjective:   By signing my name below, I, Marlana Latus, attest that this documentation has been prepared under the direction and in the presence of Ann Held, DO 11/13/22   Patient ID: [name], female    DOB: September 04, 1981, 42 y.o.   MRN: [id number]  Chief Complaint  Patient presents with   Annual Exam    Pt states not fasting     HPI Patient is in today for a comprehensive physical exam. She is overall well.   After her husband's sudden passing she received online counseling and was on Wellbutrin. She is currently not receiving online counseling anymore.   Last mammogram: 06/08/2022. Abnormal mass found within the upper-outer right breast. Ultrasound guided biopsy performed on 06/17/2022. 6 month follow up mammogram and ultrasound scheduled on 12/24/2022.  Last pap: 05/14/2022. A papilloma was found. She had a biopsy afterwards.   She has received the first two Covid vaccines.   She denies having any fever, new muscle pain, new joint pain, new moles, congestion, sinus pain, sore throat, chest pain, palpitations, cough, SOB, wheezing, n/v/d, constipation, blood in stool, dysuria, frequency, hematuria, or headaches at this time.  Past Medical History:  Diagnosis Date   ADHD    Anemia    Anxiety    Anxiety and depression    History of gestational hypertension    Leukopenia    Pre-eclampsia    2010    Past Surgical History:  Procedure Laterality Date   CESAREAN SECTION     2010   EYE SURGERY  12/25/2016   Lasix Eye Surgery    Family History  Problem Relation Age of Onset   Hypertension Father    Diabetes Father        Type 2   Hyperlipidemia Father    Lung cancer Maternal Aunt    Liver cancer Maternal Aunt    Heart disease Paternal Grandmother    Pancreatic cancer Paternal Grandfather     Social History   Socioeconomic History   Marital status: Widowed    Spouse name: Evagene Reaver   Number of children: 1   Years of education: Not on file    Highest education level: Master's degree (e.g., MA, MS, MEng, MEd, MSW, MBA)  Occupational History   Occupation: Programmer, multimedia: [employer]    Comment: nurse, Neuro ICU  Tobacco Use   Smoking status: Never   Smokeless tobacco: Never  Vaping Use   Vaping Use: Never used  Substance and Sexual Activity   Alcohol use: No   Drug use: No   Sexual activity: Yes    Partners: Male  Other Topics Concern   Not on file  Social History Narrative   Not on file   Social Determinants of Health   Financial Resource Strain: Not on file  Food Insecurity: Not on file  Transportation Needs: Not on file  Physical Activity: Not on file  Stress: Not on file  Social Connections: Not on file  Intimate Partner Violence: Not on file    Outpatient Medications Prior to Visit  Medication Sig Dispense Refill   buPROPion (WELLBUTRIN XL) 300 MG 24 hr tablet 1 tablet every day by oral route.     cephALEXin (KEFLEX) 500 MG capsule Take 1 capsule (500 mg total) by mouth 2 (two) times daily. 14 capsule 0   escitalopram (LEXAPRO) 20 MG tablet TAKE 1 TABLET (20 MG TOTAL) BY MOUTH DAILY. 90 tablet 0   LORazepam (ATIVAN) 0.5  MG tablet Take 1 tablet (0.5 mg total) by mouth 2 (two) times daily as needed for anxiety. (Patient not taking: Reported on 11/13/2022) 30 tablet 1   methylphenidate (CONCERTA) 54 MG PO CR tablet Take 1 tablet (54 mg total) by mouth every morning. (Patient not taking: Reported on 11/13/2022) 30 tablet 0   nitrofurantoin, macrocrystal-monohydrate, (MACROBID) 100 MG capsule Take 1 capsule (100 mg total) by mouth 2 (two) times daily for 5 days 10 capsule 0   phenazopyridine (PYRIDIUM) 100 MG tablet Take 1 tablet (100 mg total) by mouth every 8 (eight) hours as needed for 2 days 6 tablet 0   SUMAtriptan (IMITREX) 50 MG tablet TAKE 1 TAB AT ONSET OF MIGRAINE. MAY REPEAT IN 2 HRS, IF NEEDED. MAX DOSE: 2 TABS/DAY. #12/30. (Patient not taking: Reported on 11/13/2022) 12 tablet 5   SUMAtriptan (IMITREX)  50 MG tablet Take 1 tablet (50 mg total) by mouth at onset of migraine. May repeat in 2 hours if needed. Max  of 2 tablets/24 hours (Patient not taking: Reported on 11/13/2022) 12 tablet 5   No facility-administered medications prior to visit.    Allergies  Allergen Reactions   Sulfonamide Derivatives Swelling and Rash    Review of Systems  Constitutional:  Negative for fever and malaise/fatigue.  HENT:  Negative for congestion.   Eyes:  Negative for blurred vision.  Respiratory:  Negative for shortness of breath.   Cardiovascular:  Negative for chest pain, palpitations and leg swelling.  Gastrointestinal:  Negative for abdominal pain, blood in stool and nausea.  Genitourinary:  Negative for dysuria and frequency.  Musculoskeletal:  Negative for falls.  Skin:  Negative for rash.  Neurological:  Negative for dizziness, loss of consciousness and headaches.  Endo/Heme/Allergies:  Negative for environmental allergies.  Psychiatric/Behavioral:  Negative for depression. The patient is not nervous/anxious.        Objective:    Physical Exam Vitals and nursing note reviewed.  Constitutional:      Appearance: She is well-developed.  HENT:     Head: Normocephalic and atraumatic.  Eyes:     Conjunctiva/sclera: Conjunctivae normal.  Neck:     Thyroid: No thyromegaly.     Vascular: No carotid bruit or JVD.  Cardiovascular:     Rate and Rhythm: Normal rate and regular rhythm.     Heart sounds: Normal heart sounds. No murmur heard. Pulmonary:     Effort: Pulmonary effort is normal. No respiratory distress.     Breath sounds: Normal breath sounds. No wheezing or rales.  Chest:     Chest wall: No tenderness.  Musculoskeletal:     Cervical back: Normal range of motion and neck supple.  Neurological:     Mental Status: She is alert and oriented to person, place, and time.     BP 100/78 (BP Location: Left Arm, Patient Position: Sitting, Cuff Size: Normal)   Pulse 82   Temp 98.3 F  (36.8 C) (Oral)   Resp 18   Ht 5' 6"$  (1.676 m)   Wt 169 lb 3.2 oz (76.7 kg)   SpO2 98%   BMI 27.31 kg/m  Wt Readings from Last 3 Encounters:  11/13/22 169 lb 3.2 oz (76.7 kg)  05/12/21 200 lb 3.2 oz (90.8 kg)  05/09/20 210 lb 12.8 oz (95.6 kg)       Assessment & Plan:  Preventative health care Assessment & Plan: Ghm utd Check labs  See AVS   Orders: -     CBC with  Differential/Platelet -     Comprehensive metabolic panel -     Lipid panel -     TSH  Other migraine without status migrainosus, intractable -     SUMAtriptan Succinate; May repeat in 2 hours if headache persists or recurs.  Dispense: 12 tablet; Refill: 5  Depression with anxiety Assessment & Plan: Doing well with wellbutrin   Orders: -     buPROPion HCl ER (XL); 1 tablet every day by oral route.  Dispense: 90 tablet; Refill: 3  Need for hepatitis C screening test -     Hepatitis C antibody     I,Rachel Rivera,acting as a scribe for Ann Held, DO.,have documented all relevant documentation on the behalf of Ann Held, DO,as directed by  Ann Held, DO while in the presence of Ann Held, DO.   I, Ann Held, DO, personally preformed the services described in this documentation.  All medical record entries made by the scribe were at my direction and in my presence.  I have reviewed the chart and discharge instructions (if applicable) and agree that the record reflects my personal performance and is accurate and complete. 11/13/22   Ann Held, DO

## 2022-11-13 NOTE — Assessment & Plan Note (Signed)
Doing well with wellbutrin

## 2022-11-14 LAB — COMPREHENSIVE METABOLIC PANEL
ALT: 10 IU/L (ref 0–32)
AST: 14 IU/L (ref 0–40)
Albumin/Globulin Ratio: 2 (ref 1.2–2.2)
Albumin: 4.5 g/dL (ref 3.9–4.9)
Alkaline Phosphatase: 66 IU/L (ref 44–121)
BUN/Creatinine Ratio: 18 (ref 9–23)
BUN: 16 mg/dL (ref 6–24)
Bilirubin Total: 0.3 mg/dL (ref 0.0–1.2)
CO2: 18 mmol/L — ABNORMAL LOW (ref 20–29)
Calcium: 9.3 mg/dL (ref 8.7–10.2)
Chloride: 103 mmol/L (ref 96–106)
Creatinine, Ser: 0.87 mg/dL (ref 0.57–1.00)
Globulin, Total: 2.2 g/dL (ref 1.5–4.5)
Glucose: 78 mg/dL (ref 70–99)
Potassium: 4.6 mmol/L (ref 3.5–5.2)
Sodium: 138 mmol/L (ref 134–144)
Total Protein: 6.7 g/dL (ref 6.0–8.5)
eGFR: 86 mL/min/{1.73_m2} (ref >59)

## 2022-11-14 LAB — LIPID PANEL
Chol/HDL Ratio: 2.4 ratio (ref 0.0–4.4)
Cholesterol, Total: 181 mg/dL (ref 100–199)
HDL: 77 mg/dL (ref >39)
LDL Chol Calc (NIH): 89 mg/dL (ref 0–99)
Triglycerides: 80 mg/dL (ref 0–149)
VLDL Cholesterol Cal: 15 mg/dL (ref 5–40)

## 2022-11-14 LAB — CBC WITH DIFFERENTIAL/PLATELET
Basophils Absolute: 0 10*3/uL (ref 0.0–0.2)
Basos: 1 %
EOS (ABSOLUTE): 0.1 10*3/uL (ref 0.0–0.4)
Eos: 2 %
Hematocrit: 36.3 % (ref 34.0–46.6)
Hemoglobin: 11.7 g/dL (ref 11.1–15.9)
Immature Grans (Abs): 0 10*3/uL (ref 0.0–0.1)
Immature Granulocytes: 0 %
Lymphocytes Absolute: 1.5 10*3/uL (ref 0.7–3.1)
Lymphs: 33 %
MCH: 27.9 pg (ref 26.6–33.0)
MCHC: 32.2 g/dL (ref 31.5–35.7)
MCV: 87 fL (ref 79–97)
Monocytes Absolute: 0.3 10*3/uL (ref 0.1–0.9)
Monocytes: 7 %
Neutrophils Absolute: 2.7 10*3/uL (ref 1.4–7.0)
Neutrophils: 57 %
Platelets: 313 10*3/uL (ref 150–450)
RBC: 4.19 x10E6/uL (ref 3.77–5.28)
RDW: 13.8 % (ref 11.7–15.4)
WBC: 4.6 10*3/uL (ref 3.4–10.8)

## 2022-11-14 LAB — TSH: TSH: 2.53 u[IU]/mL (ref 0.450–4.500)

## 2022-11-16 LAB — HEPATITIS C ANTIBODY: Hepatitis C Ab: NONREACTIVE

## 2022-12-24 ENCOUNTER — Other Ambulatory Visit: Payer: Self-pay | Admitting: Obstetrics and Gynecology

## 2022-12-24 ENCOUNTER — Ambulatory Visit
Admission: RE | Admit: 2022-12-24 | Discharge: 2022-12-24 | Disposition: A | Discharge status: Home / Self Care | Payer: Commercial Managed Care - PPO | Source: Ambulatory Visit | Attending: Obstetrics and Gynecology | Admitting: Obstetrics and Gynecology

## 2022-12-24 ENCOUNTER — Ambulatory Visit
Admission: RE | Admit: 2022-12-24 | Discharge: 2022-12-24 | Disposition: A | Discharge status: Home / Self Care | Payer: PRIVATE HEALTH INSURANCE | Source: Ambulatory Visit | Attending: Obstetrics and Gynecology | Admitting: Obstetrics and Gynecology

## 2022-12-24 DIAGNOSIS — D241 Benign neoplasm of right breast: Secondary | ICD-10-CM

## 2023-01-26 ENCOUNTER — Encounter: Payer: Self-pay | Admitting: Family Medicine

## 2023-01-28 ENCOUNTER — Encounter: Payer: Self-pay | Admitting: Family Medicine

## 2023-01-28 ENCOUNTER — Ambulatory Visit: Payer: PRIVATE HEALTH INSURANCE | Admitting: Family Medicine

## 2023-01-28 VITALS — BP 100/88 | HR 85 | Temp 98.0°F | Resp 18 | Ht 66.0 in | Wt 174.6 lb

## 2023-01-28 DIAGNOSIS — R350 Frequency of micturition: Secondary | ICD-10-CM | POA: Diagnosis not present

## 2023-01-28 DIAGNOSIS — R399 Unspecified symptoms and signs involving the genitourinary system: Secondary | ICD-10-CM | POA: Diagnosis not present

## 2023-01-28 LAB — POC URINALSYSI DIPSTICK (AUTOMATED)
Bilirubin, UA: NEGATIVE
Blood, UA: NEGATIVE
Glucose, UA: NEGATIVE
Ketones, UA: NEGATIVE
Nitrite, UA: NEGATIVE
Protein, UA: NEGATIVE
Spec Grav, UA: 1.01 (ref 1.010–1.025)
Urobilinogen, UA: 0.2 E.U./dL
pH, UA: 6 (ref 5.0–8.0)

## 2023-01-28 MED ORDER — CEPHALEXIN 500 MG PO CAPS: 500.0000 mg | ORAL_CAPSULE | Freq: Two times a day (BID) | ORAL | 0 refills | Status: DC

## 2023-01-28 NOTE — Progress Notes (Signed)
Subjective:   By signing my name below, I, Shehryar Baig, attest that this documentation has been prepared under the direction and in the presence of Donato Schultz, DO. 01/28/2023   Patient ID: [name], female    DOB: 1980-11-15, 42 y.o.   MRN: [id number]  Chief Complaint  Patient presents with   Urinary Frequency    Sxs started Monday, pt states having freq, urgency, odor. No Azo has been used.     Urinary Frequency  Associated symptoms include frequency and urgency. Pertinent negatives include no flank pain or vomiting.   Patient is in today for a office visit.   She complains of cloudy urine, urinary frequency and urgency, and urine odor since Monday 01/25/2023. She denies burning while urinating, back pain or flank pain.    Past Medical History:  Diagnosis Date   ADHD    Anemia    Anxiety    Anxiety and depression    History of gestational hypertension    Leukopenia    Pre-eclampsia    2010    Past Surgical History:  Procedure Laterality Date   BREAST BIOPSY Right    2023   CESAREAN SECTION     2010   EYE SURGERY  12/25/2016   Lasix Eye Surgery    Family History  Problem Relation Age of Onset   Hypertension Father    Diabetes Father        Type 2   Hyperlipidemia Father    Lung cancer Maternal Aunt    Liver cancer Maternal Aunt    Heart disease Paternal Grandmother    Pancreatic cancer Paternal Grandfather     Social History   Socioeconomic History   Marital status: Widowed    Spouse name: Mindee Robledo   Number of children: 1   Years of education: Not on file   Highest education level: Master's degree (e.g., MA, MS, MEng, MEd, MSW, MBA)  Occupational History   Occupation: Teacher, adult education: [employer]    Comment: nurse, Neuro ICU  Tobacco Use   Smoking status: Never   Smokeless tobacco: Never  Vaping Use   Vaping Use: Never used  Substance and Sexual Activity   Alcohol use: No   Drug use: No   Sexual activity: Yes     Partners: Male  Other Topics Concern   Not on file  Social History Narrative   Not on file   Social Determinants of Health   Financial Resource Strain: Low Risk  (01/26/2023)   Overall Financial Resource Strain (CARDIA)    Difficulty of Paying Living Expenses: Not hard at all  Food Insecurity: No Food Insecurity (01/26/2023)   Hunger Vital Sign    Worried About Running Out of Food in the Last Year: Never true    Ran Out of Food in the Last Year: Never true  Transportation Needs: No Transportation Needs (01/26/2023)   PRAPARE - Administrator, Civil Service (Medical): No    Lack of Transportation (Non-Medical): No  Physical Activity: Insufficiently Active (01/26/2023)   Exercise Vital Sign    Days of Exercise per Week: 3 days    Minutes of Exercise per Session: 20 min  Stress: Stress Concern Present (01/26/2023)   [employer] of Occupational Health - Occupational Stress Questionnaire    Feeling of Stress : To some extent  Social Connections: Moderately Isolated (01/26/2023)   Social Connection and Isolation Panel [NHANES]    Frequency of Communication with  Friends and Family: More than three times a week    Frequency of Social Gatherings with Friends and Family: Three times a week    Attends Religious Services: More than 4 times per year    Active Member of Clubs or Organizations: No    Attends Banker Meetings: Not on file    Marital Status: Widowed  Intimate Partner Violence: Not on file    Outpatient Medications Prior to Visit  Medication Sig Dispense Refill   buPROPion (WELLBUTRIN XL) 300 MG 24 hr tablet 1 tablet every day by oral route. 90 tablet 3   SUMAtriptan (IMITREX) 50 MG tablet May repeat in 2 hours if headache persists or recurs. 12 tablet 5   No facility-administered medications prior to visit.    Allergies  Allergen Reactions   Sulfonamide Derivatives Swelling and Rash    Review of Systems  Constitutional:  Negative for fever  and malaise/fatigue.  HENT:  Negative for congestion.   Eyes:  Negative for blurred vision.  Respiratory:  Negative for cough and shortness of breath.   Cardiovascular:  Negative for chest pain, palpitations and leg swelling.  Gastrointestinal:  Negative for vomiting.  Genitourinary:  Positive for frequency and urgency. Negative for dysuria and flank pain.       (+)cloudy urine (+)urine odor  Musculoskeletal:  Negative for back pain.  Skin:  Negative for rash.  Neurological:  Negative for loss of consciousness and headaches.       Objective:    Physical Exam Vitals and nursing note reviewed.  Constitutional:      General: She is not in acute distress.    Appearance: Normal appearance. She is not ill-appearing.  HENT:     Head: Normocephalic and atraumatic.     Right Ear: External ear normal.     Left Ear: External ear normal.  Eyes:     Extraocular Movements: Extraocular movements intact.     Pupils: Pupils are equal, round, and reactive to light.  Cardiovascular:     Rate and Rhythm: Normal rate and regular rhythm.     Heart sounds: Normal heart sounds. No murmur heard.    No gallop.  Pulmonary:     Effort: Pulmonary effort is normal. No respiratory distress.     Breath sounds: Normal breath sounds. No wheezing or rales.  Abdominal:     General: Bowel sounds are normal.     Palpations: Abdomen is soft.     Tenderness: There is no abdominal tenderness. There is no right CVA tenderness, left CVA tenderness, guarding or rebound.  Genitourinary:    Comments: Urine screening showed trace bacteria Skin:    General: Skin is warm and dry.  Neurological:     Mental Status: She is alert and oriented to person, place, and time.  Psychiatric:        Judgment: Judgment normal.     BP 100/88 (BP Location: Left Arm, Patient Position: Sitting, Cuff Size: Normal)   Pulse 85   Temp 98 F (36.7 C) (Oral)   Resp 18   Ht 5\' 6"  (1.676 m)   Wt 174 lb 9.6 oz (79.2 kg)   SpO2 100%    BMI 28.18 kg/m  Wt Readings from Last 3 Encounters:  01/28/23 174 lb 9.6 oz (79.2 kg)  11/13/22 169 lb 3.2 oz (76.7 kg)  05/12/21 200 lb 3.2 oz (90.8 kg)       Assessment & Plan:  Urinary frequency -     POCT Urinalysis  Dipstick (Automated) -     Urine Culture  UTI symptoms Assessment & Plan: Ua --  sm leuk Culture pending  Keflex bid x 5 days    Orders: -     Cephalexin; Take 1 capsule (500 mg total) by mouth 2 (two) times daily.  Dispense: 14 capsule; Refill: 0    I, Donato Schultz, DO, personally preformed the services described in this documentation.  All medical record entries made by the scribe were at my direction and in my presence.  I have reviewed the chart and discharge instructions (if applicable) and agree that the record reflects my personal performance and is accurate and complete. 01/28/2023   I,Shehryar Baig,acting as a scribe for Donato Schultz, DO.,have documented all relevant documentation on the behalf of Donato Schultz, DO,as directed by  Donato Schultz, DO while in the presence of Donato Schultz, DO.   Donato Schultz, DO

## 2023-01-28 NOTE — Assessment & Plan Note (Signed)
Ua --  sm leuk Culture pending  Keflex bid x 5 days

## 2023-01-31 LAB — URINE CULTURE
MICRO NUMBER:: 14904916
SPECIMEN QUALITY:: ADEQUATE

## 2023-04-29 LAB — HM PAP SMEAR: HM Pap smear: NORMAL

## 2023-06-14 ENCOUNTER — Ambulatory Visit
Admission: RE | Admit: 2023-06-14 | Discharge: 2023-06-14 | Disposition: A | Discharge status: Home / Self Care | Payer: PRIVATE HEALTH INSURANCE | Source: Ambulatory Visit | Attending: Obstetrics and Gynecology | Admitting: Obstetrics and Gynecology

## 2023-06-14 DIAGNOSIS — D241 Benign neoplasm of right breast: Secondary | ICD-10-CM

## 2023-06-18 ENCOUNTER — Other Ambulatory Visit: Payer: PRIVATE HEALTH INSURANCE

## 2023-07-08 ENCOUNTER — Encounter: Payer: Self-pay | Admitting: Family Medicine

## 2023-07-08 ENCOUNTER — Ambulatory Visit (INDEPENDENT_AMBULATORY_CARE_PROVIDER_SITE_OTHER): Payer: PRIVATE HEALTH INSURANCE | Admitting: Family Medicine

## 2023-07-08 VITALS — BP 118/88 | HR 91 | Temp 98.4°F | Resp 18 | Ht 66.0 in | Wt 175.0 lb

## 2023-07-08 DIAGNOSIS — Z79899 Other long term (current) drug therapy: Secondary | ICD-10-CM

## 2023-07-08 DIAGNOSIS — R41840 Attention and concentration deficit: Secondary | ICD-10-CM

## 2023-07-08 MED ORDER — METHYLPHENIDATE HCL ER (OSM) 36 MG PO TBCR: 36.0000 mg | EXTENDED_RELEASE_TABLET | Freq: Every day | ORAL | 0 refills | Status: DC

## 2023-07-08 NOTE — Progress Notes (Signed)
Established Patient Office Visit  Subjective   Patient ID: [name], female    DOB: 1981/08/23  Age: 42 y.o. MRN: [id number]  Chief Complaint  Patient presents with   ADHD    HPI Discussed the use of AI scribe software for clinical note transcription with the patient, who gave verbal consent to proceed.  History of Present Illness   The patient, with a history of ADHD, is seeking to restart medication management. She was previously on Concerta 36mg  in 2018, which was effective and well-tolerated. She denies any side effects, including chest pain. The patient is currently applying to FNP school and is awaiting letters of recommendation. She has already taken some classes through an online program and is hoping to get into the [facility] program.      Patient Active Problem List   Diagnosis Date Noted   UTI symptoms 01/28/2023   Urinary frequency 01/28/2023   Depression with anxiety 05/12/2021   Grief 05/12/2021   Close exposure to COVID-19 virus 04/10/2019   Chronic migraine 07/12/2018   Attention deficit disorder 03/19/2017   Adjustment disorder with mixed anxiety and depressed mood 03/19/2017   Preventative health care 02/20/2017   Acute cystitis with hematuria 07/06/2014   Anxiety 05/11/2012   SYMPTOM, INSOMNIA NOS 06/08/2007   Past Medical History:  Diagnosis Date   ADHD    Anemia    Anxiety    Anxiety and depression    History of gestational hypertension    Leukopenia    Pre-eclampsia    2010   Past Surgical History:  Procedure Laterality Date   BREAST BIOPSY Right    2023   CESAREAN SECTION     2010   EYE SURGERY  12/25/2016   Lasix Eye Surgery   Social History   Tobacco Use   Smoking status: Never   Smokeless tobacco: Never  Vaping Use   Vaping status: Never Used  Substance Use Topics   Alcohol use: No   Drug use: No   Social History   Socioeconomic History   Marital status: Widowed    Spouse name: Skiler Tye   Number of children: 1    Years of education: Not on file   Highest education level: Master's degree (e.g., MA, MS, MEng, MEd, MSW, MBA)  Occupational History   Occupation: Teacher, 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, Neuro ICU  Tobacco Use   Smoking status: Never   Smokeless tobacco: Never  Vaping Use   Vaping status: Never Used  Substance and Sexual Activity   Alcohol use: No   Drug use: No   Sexual activity: Yes    Partners: Male  Other Topics Concern   Not on file  Social History Narrative   Not on file   Social Determinants of Health   Financial Resource Strain: Low Risk  (01/26/2023)   Overall Financial Resource Strain (CARDIA)    Difficulty of Paying Living Expenses: Not hard at all  Food Insecurity: No Food Insecurity (01/26/2023)   Hunger Vital Sign    Worried About Running Out of Food in the Last Year: Never true    Ran Out of Food in the Last Year: Never true  Transportation Needs: No Transportation Needs (01/26/2023)   PRAPARE - Administrator, Civil Service (Medical): No    Lack of Transportation (Non-Medical): No  Physical Activity: Insufficiently Active (01/26/2023)   Exercise Vital Sign    Days of Exercise per Week: 3  days    Minutes of Exercise per Session: 20 min  Stress: Stress Concern Present (01/26/2023)   [employer] of Occupational Health - Occupational Stress Questionnaire    Feeling of Stress : To some extent  Social Connections: Moderately Isolated (01/26/2023)   Social Connection and Isolation Panel [NHANES]    Frequency of Communication with Friends and Family: More than three times a week    Frequency of Social Gatherings with Friends and Family: Three times a week    Attends Religious Services: More than 4 times per year    Active Member of Clubs or Organizations: No    Attends Banker Meetings: Not on file    Marital Status: Widowed  Catering manager Violence: Not on file   Family Status  Relation Name Status   Mother  Alive    Father  Alive   Mat Aunt  (Not Specified)   PGM  (Not Specified)   PGF  (Not Specified)  No partnership data on file   Family History  Problem Relation Age of Onset   Hypertension Father    Diabetes Father        Type 2   Hyperlipidemia Father    Lung cancer Maternal Aunt    Liver cancer Maternal Aunt    Heart disease Paternal Grandmother    Pancreatic cancer Paternal Grandfather    Allergies  Allergen Reactions   Sulfonamide Derivatives Swelling and Rash      ROS    Objective:     BP 118/88 (BP Location: Left Arm, Patient Position: Sitting, Cuff Size: Normal)   Pulse 91   Temp 98.4 F (36.9 C) (Oral)   Resp 18   Ht 5\' 6"  (1.676 m)   Wt 175 lb (79.4 kg)   SpO2 99%   BMI 28.25 kg/m  BP Readings from Last 3 Encounters:  07/08/23 118/88  01/28/23 100/88  11/13/22 100/78   Wt Readings from Last 3 Encounters:  07/08/23 175 lb (79.4 kg)  01/28/23 174 lb 9.6 oz (79.2 kg)  11/13/22 169 lb 3.2 oz (76.7 kg)   SpO2 Readings from Last 3 Encounters:  07/08/23 99%  01/28/23 100%  11/13/22 98%      Physical Exam   No results found for any visits on 07/08/23.  Last CBC Lab Results  Component Value Date   WBC 4.6 11/13/2022   HGB 11.7 11/13/2022   HCT 36.3 11/13/2022   MCV 87 11/13/2022   MCH 27.9 11/13/2022   RDW 13.8 11/13/2022   PLT 313 11/13/2022   Last metabolic panel Lab Results  Component Value Date   GLUCOSE 78 11/13/2022   NA 138 11/13/2022   K 4.6 11/13/2022   CL 103 11/13/2022   CO2 18 (L) 11/13/2022   BUN 16 11/13/2022   CREATININE 0.87 11/13/2022   EGFR 86 11/13/2022   CALCIUM 9.3 11/13/2022   PROT 6.7 11/13/2022   ALBUMIN 4.5 11/13/2022   LABGLOB 2.2 11/13/2022   AGRATIO 2.0 11/13/2022   BILITOT 0.3 11/13/2022   ALKPHOS 66 11/13/2022   AST 14 11/13/2022   ALT 10 11/13/2022   Last lipids Lab Results  Component Value Date   CHOL 181 11/13/2022   HDL 77 11/13/2022   LDLCALC 89 11/13/2022   TRIG 80 11/13/2022   CHOLHDL 2.4  11/13/2022   Last hemoglobin A1c Lab Results  Component Value Date   HGBA1C 5.4 06/16/2018   Last thyroid functions Lab Results  Component Value Date   TSH 2.530  11/13/2022   T3TOTAL 108 06/16/2018   Last vitamin D Lab Results  Component Value Date   VD25OH 24.2 (L) 06/16/2018   Last vitamin B12 and Folate Lab Results  Component Value Date   VITAMINB12 1,775 (H) 06/16/2018   FOLATE >20.0 06/16/2018      The 10-year ASCVD risk score (Arnett DK, et al., 2019) is: 0.2%    Assessment & Plan:   Problem List Items Addressed This Visit       Unprioritized   Attention deficit disorder   Relevant Medications   methylphenidate (CONCERTA) 36 MG PO CR tablet   Other Visit Diagnoses     High risk medication use    -  Primary   Relevant Orders   Drug Monitoring Panel [id number] , Urine     Assessment and Plan Database reviewed  Contract updated    Attention Deficit Hyperactivity Disorder (ADHD) Patient previously managed on Concerta 36mg  in 2018, now requesting to restart medication due to increased academic demands. No history of chest pain or other adverse effects when previously on medication. -Start Concerta 36mg  daily, dispense 1 month supply. -Check efficacy and tolerability in 1 month. -If effective, consider providing 3 separate prescriptions at a time or post-dating prescriptions if pharmacy allows. -Plan for follow-up every 6 months as required for controlled substance management.  Influenza Vaccination Patient reports receiving flu shot at work. -No further action needed.        No follow-ups on file.    Donato Schultz, DO

## 2023-07-10 LAB — DRUG MONITORING PANEL 376104, URINE
Amphetamines: NEGATIVE ng/mL (ref <500)
Barbiturates: NEGATIVE ng/mL (ref <300)
Benzodiazepines: NEGATIVE ng/mL (ref <100)
Cocaine Metabolite: NEGATIVE ng/mL (ref <150)
Desmethyltramadol: NEGATIVE ng/mL (ref <100)
Opiates: NEGATIVE ng/mL (ref <100)
Oxycodone: NEGATIVE ng/mL (ref <100)
Tramadol: NEGATIVE ng/mL (ref <100)

## 2023-07-10 LAB — DM TEMPLATE

## 2023-08-16 ENCOUNTER — Encounter: Payer: Self-pay | Admitting: Family Medicine

## 2023-08-16 ENCOUNTER — Other Ambulatory Visit: Payer: Self-pay | Admitting: Medical Genetics

## 2023-08-16 ENCOUNTER — Other Ambulatory Visit: Payer: Self-pay | Admitting: Family Medicine

## 2023-08-16 DIAGNOSIS — R41840 Attention and concentration deficit: Secondary | ICD-10-CM

## 2023-08-16 DIAGNOSIS — Z111 Encounter for screening for respiratory tuberculosis: Secondary | ICD-10-CM

## 2023-08-16 DIAGNOSIS — Z006 Encounter for examination for normal comparison and control in clinical research program: Secondary | ICD-10-CM

## 2023-08-16 MED ORDER — METHYLPHENIDATE HCL ER (OSM) 36 MG PO TBCR: 36.0000 mg | EXTENDED_RELEASE_TABLET | Freq: Every day | ORAL | 0 refills | Status: DC

## 2023-08-16 NOTE — Telephone Encounter (Signed)
Requesting: Concerta 36mg  Contract: 07/08/2023 UDS: 07/08/2023 Last Visit: 07/08/2023 Next Visit: 11/15/2023 Last Refill: 07/08/2023  Please Advise

## 2023-09-14 ENCOUNTER — Other Ambulatory Visit (HOSPITAL_COMMUNITY)
Admission: RE | Admit: 2023-09-14 | Discharge: 2023-09-14 | Disposition: A | Discharge status: Home / Self Care | Payer: Self-pay | Source: Ambulatory Visit | Attending: Oncology | Admitting: Oncology

## 2023-09-14 DIAGNOSIS — Z006 Encounter for examination for normal comparison and control in clinical research program: Secondary | ICD-10-CM | POA: Insufficient documentation

## 2023-09-21 LAB — GENECONNECT MOLECULAR SCREEN: Genetic Analysis Overall Interpretation: NEGATIVE

## 2023-11-01 ENCOUNTER — Other Ambulatory Visit: Payer: Self-pay | Admitting: Family Medicine

## 2023-11-01 DIAGNOSIS — R41840 Attention and concentration deficit: Secondary | ICD-10-CM

## 2023-11-01 MED ORDER — METHYLPHENIDATE HCL ER (OSM) 36 MG PO TBCR: 36.0000 mg | EXTENDED_RELEASE_TABLET | Freq: Every day | ORAL | 0 refills | Status: DC

## 2023-11-01 NOTE — Telephone Encounter (Signed)
Requesting: concerta Contract:08/02/23 UDS:08/02/23 Last Visit:07/08/23 Next Visit:n/a Last Refill:08/16/23  Please Advise

## 2023-11-15 ENCOUNTER — Encounter: Payer: Self-pay | Admitting: Family Medicine

## 2023-11-15 ENCOUNTER — Ambulatory Visit (INDEPENDENT_AMBULATORY_CARE_PROVIDER_SITE_OTHER): Payer: PRIVATE HEALTH INSURANCE | Admitting: Family Medicine

## 2023-11-15 VITALS — BP 110/80 | HR 86 | Temp 97.8°F | Resp 18 | Ht 66.0 in | Wt 177.4 lb

## 2023-11-15 DIAGNOSIS — F418 Other specified anxiety disorders: Secondary | ICD-10-CM | POA: Diagnosis not present

## 2023-11-15 DIAGNOSIS — R41840 Attention and concentration deficit: Secondary | ICD-10-CM

## 2023-11-15 DIAGNOSIS — Z0000 Encounter for general adult medical examination without abnormal findings: Secondary | ICD-10-CM

## 2023-11-15 DIAGNOSIS — G43819 Other migraine, intractable, without status migrainosus: Secondary | ICD-10-CM | POA: Diagnosis not present

## 2023-11-15 LAB — COMPREHENSIVE METABOLIC PANEL
ALT: 10 U/L (ref 0–35)
AST: 11 U/L (ref 0–37)
Albumin: 4.4 g/dL (ref 3.5–5.2)
Alkaline Phosphatase: 55 U/L (ref 39–117)
BUN: 15 mg/dL (ref 6–23)
CO2: 26 meq/L (ref 19–32)
Calcium: 8.9 mg/dL (ref 8.4–10.5)
Chloride: 103 meq/L (ref 96–112)
Creatinine, Ser: 0.75 mg/dL (ref 0.40–1.20)
GFR: 98.59 mL/min (ref >60.00)
Glucose, Bld: 90 mg/dL (ref 70–99)
Potassium: 4.5 meq/L (ref 3.5–5.1)
Sodium: 137 meq/L (ref 135–145)
Total Bilirubin: 0.5 mg/dL (ref 0.2–1.2)
Total Protein: 6.6 g/dL (ref 6.0–8.3)

## 2023-11-15 LAB — CBC WITH DIFFERENTIAL/PLATELET
Basophils Absolute: 0 10*3/uL (ref 0.0–0.1)
Basophils Relative: 0.7 % (ref 0.0–3.0)
Eosinophils Absolute: 0.1 10*3/uL (ref 0.0–0.7)
Eosinophils Relative: 2.1 % (ref 0.0–5.0)
HCT: 40 % (ref 36.0–46.0)
Hemoglobin: 13.5 g/dL (ref 12.0–15.0)
Lymphocytes Relative: 33.4 % (ref 12.0–46.0)
Lymphs Abs: 1.3 10*3/uL (ref 0.7–4.0)
MCHC: 33.8 g/dL (ref 30.0–36.0)
MCV: 93.2 fL (ref 78.0–100.0)
Monocytes Absolute: 0.2 10*3/uL (ref 0.1–1.0)
Monocytes Relative: 5.6 % (ref 3.0–12.0)
Neutro Abs: 2.3 10*3/uL (ref 1.4–7.7)
Neutrophils Relative %: 58.2 % (ref 43.0–77.0)
Platelets: 304 10*3/uL (ref 150.0–400.0)
RBC: 4.3 Mil/uL (ref 3.87–5.11)
RDW: 12.9 % (ref 11.5–15.5)
WBC: 4 10*3/uL (ref 4.0–10.5)

## 2023-11-15 LAB — LIPID PANEL
Cholesterol: 171 mg/dL (ref 0–200)
HDL: 65.4 mg/dL (ref >39.00)
LDL Cholesterol: 88 mg/dL (ref 0–99)
NonHDL: 105.39
Total CHOL/HDL Ratio: 3
Triglycerides: 89 mg/dL (ref 0.0–149.0)
VLDL: 17.8 mg/dL (ref 0.0–40.0)

## 2023-11-15 LAB — TSH: TSH: 1.81 u[IU]/mL (ref 0.35–5.50)

## 2023-11-15 MED ORDER — SUMATRIPTAN SUCCINATE 50 MG PO TABS: ORAL_TABLET | ORAL | 5 refills | Status: AC

## 2023-11-15 MED ORDER — BUPROPION HCL ER (XL) 300 MG PO TB24: ORAL_TABLET | ORAL | 3 refills | Status: AC

## 2023-11-15 NOTE — Assessment & Plan Note (Signed)
Ghm utd Check labs  See AVS  Health Maintenance  Topic Date Due   COVID-19 Vaccine (2 - Mixed Product risk series) 12/01/2023 (Originally 11/02/2019)   HIV Screening  11/14/2024 (Originally 01/07/1997)   DTaP/Tdap/Td (4 - Td or Tdap) 04/28/2024   Cervical Cancer Screening (HPV/Pap Cotest)  04/28/2026   INFLUENZA VACCINE  Completed   Hepatitis C Screening  Completed   HPV VACCINES  Aged Out

## 2023-11-15 NOTE — Progress Notes (Signed)
Established Patient Office Visit  Subjective   Patient ID: [name], female    DOB: 1981-07-17  Age: 43 y.o. MRN: [id number]  Chief Complaint  Patient presents with   Annual Exam    Pt states fasting     HPI Discussed the use of AI scribe software for clinical note transcription with the patient, who gave verbal consent to proceed.  History of Present Illness   [name] is a 43 year old female who presents with elevated blood pressure and stress management concerns.  She has experienced elevated blood pressure readings, with a diastolic pressure of 88 and a subsequent reading of 140/100, compared to her usual 120/70. 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 She is also managing responsibilities at home with two children.  She is scheduled for a hysterectomy due to concerns about C-section scarring. She has discussed pain management post-surgery and is currently on a drug contract. She is taking Concerta, Imitrex, and Wellbutrin, and has requested refills for Imitrex and Wellbutrin.  She reports a significant knot in her neck, for which she has seen a Land. She is considering massages or dry needling as treatment options. No issues with her stomach or joints.      Patient Active Problem List   Diagnosis Date Noted   UTI symptoms 01/28/2023   Urinary frequency 01/28/2023   Depression with anxiety 05/12/2021   Grief 05/12/2021   Close exposure to COVID-19 virus 04/10/2019   Chronic migraine 07/12/2018   Attention deficit disorder 03/19/2017   Adjustment disorder with mixed anxiety and depressed mood 03/19/2017   Preventative health care 02/20/2017   Acute cystitis with hematuria 07/06/2014   Anxiety 05/11/2012   SYMPTOM, INSOMNIA NOS 06/08/2007   Past Medical History:  Diagnosis Date   ADHD    Anemia    Anxiety    Anxiety and depression    History of  gestational hypertension    Leukopenia    Pre-eclampsia    2010   Past Surgical History:  Procedure Laterality Date   BREAST BIOPSY Right    2023   CESAREAN SECTION     2010   EYE SURGERY  12/25/2016   Lasix Eye Surgery   Social History   Tobacco Use   Smoking status: Never   Smokeless tobacco: Never  Vaping Use   Vaping status: Never Used  Substance Use Topics   Alcohol use: No   Drug use: No   Social History   Socioeconomic History   Marital status: Widowed    Spouse name: Davonna Ertl   Number of children: 1   Years of education: Not on file   Highest education level: Master's degree (e.g., MA, MS, MEng, MEd, MSW, MBA)  Occupational History   Occupation: Teacher, 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, Neuro ICU  Tobacco Use   Smoking status: Never   Smokeless tobacco: Never  Vaping Use   Vaping status: Never Used  Substance and Sexual Activity   Alcohol use: No   Drug use: No   Sexual activity: Yes    Partners: Male  Other Topics Concern   Not on file  Social History Narrative   Not on file   Social Drivers of Health   Financial Resource Strain: Low Risk  (01/26/2023)   Overall Financial Resource Strain (CARDIA)    Difficulty of Paying Living Expenses: Not hard  at all  Food Insecurity: No Food Insecurity (01/26/2023)   Hunger Vital Sign    Worried About Running Out of Food in the Last Year: Never true    Ran Out of Food in the Last Year: Never true  Transportation Needs: No Transportation Needs (01/26/2023)   PRAPARE - Administrator, Civil Service (Medical): No    Lack of Transportation (Non-Medical): No  Physical Activity: Insufficiently Active (01/26/2023)   Exercise Vital Sign    Days of Exercise per Week: 3 days    Minutes of Exercise per Session: 20 min  Stress: Stress Concern Present (01/26/2023)   [employer] of Occupational Health - Occupational Stress Questionnaire    Feeling of Stress : To some extent  Social Connections:  Moderately Isolated (01/26/2023)   Social Connection and Isolation Panel [NHANES]    Frequency of Communication with Friends and Family: More than three times a week    Frequency of Social Gatherings with Friends and Family: Three times a week    Attends Religious Services: More than 4 times per year    Active Member of Clubs or Organizations: No    Attends Banker Meetings: Not on file    Marital Status: Widowed  Catering manager Violence: Not on file   Family Status  Relation Name Status   Mother  Alive   Father  Alive   Mat Aunt  (Not Specified)   PGM  (Not Specified)   PGF  (Not Specified)  No partnership data on file   Family History  Problem Relation Age of Onset   Hypertension Father    Diabetes Father        Type 2   Hyperlipidemia Father    Lung cancer Maternal Aunt    Liver cancer Maternal Aunt    Heart disease Paternal Grandmother    Pancreatic cancer Paternal Grandfather    Allergies  Allergen Reactions   Sulfonamide Derivatives Swelling and Rash      Review of Systems  Constitutional:  Negative for chills, fever and malaise/fatigue.  HENT:  Negative for congestion and hearing loss.   Eyes:  Negative for blurred vision and discharge.  Respiratory:  Negative for cough, sputum production and shortness of breath.   Cardiovascular:  Negative for chest pain, palpitations and leg swelling.  Gastrointestinal:  Negative for abdominal pain, blood in stool, constipation, diarrhea, heartburn, nausea and vomiting.  Genitourinary:  Negative for dysuria, frequency, hematuria and urgency.  Musculoskeletal:  Negative for back pain, falls and myalgias.  Skin:  Negative for rash.  Neurological:  Negative for dizziness, sensory change, loss of consciousness, weakness and headaches.  Endo/Heme/Allergies:  Negative for environmental allergies. Does not bruise/bleed easily.  Psychiatric/Behavioral:  Negative for depression and suicidal ideas. The patient is not  nervous/anxious and does not have insomnia.       Objective:     BP 110/80   Pulse 86   Temp 97.8 F (36.6 C) (Oral)   Resp 18   Ht 5\' 6"  (1.676 m)   Wt 177 lb 6.4 oz (80.5 kg)   LMP 10/30/2023   SpO2 98%   BMI 28.63 kg/m  BP Readings from Last 3 Encounters:  11/15/23 110/80  07/08/23 118/88  01/28/23 100/88   Wt Readings from Last 3 Encounters:  11/15/23 177 lb 6.4 oz (80.5 kg)  07/08/23 175 lb (79.4 kg)  01/28/23 174 lb 9.6 oz (79.2 kg)   SpO2 Readings from Last 3 Encounters:  11/15/23 98%  07/08/23 99%  01/28/23 100%      Physical Exam Vitals and nursing note reviewed.  Constitutional:      General: She is not in acute distress.    Appearance: Normal appearance. She is well-developed.  HENT:     Head: Normocephalic and atraumatic.     Right Ear: Tympanic membrane, ear canal and external ear normal. There is no impacted cerumen.     Left Ear: Tympanic membrane, ear canal and external ear normal. There is no impacted cerumen.     Nose: Nose normal.     Mouth/Throat:     Mouth: Mucous membranes are moist.     Pharynx: Oropharynx is clear. No oropharyngeal exudate or posterior oropharyngeal erythema.  Eyes:     General: No scleral icterus.       Right eye: No discharge.        Left eye: No discharge.     Conjunctiva/sclera: Conjunctivae normal.     Pupils: Pupils are equal, round, and reactive to light.  Neck:     Thyroid: No thyromegaly or thyroid tenderness.     Vascular: No JVD.  Cardiovascular:     Rate and Rhythm: Normal rate and regular rhythm.     Heart sounds: Normal heart sounds. No murmur heard. Pulmonary:     Effort: Pulmonary effort is normal. No respiratory distress.     Breath sounds: Normal breath sounds.  Abdominal:     General: Bowel sounds are normal. There is no distension.     Palpations: Abdomen is soft. There is no mass.     Tenderness: There is no abdominal tenderness. There is no guarding or rebound.  Genitourinary:    Vagina:  Normal.  Musculoskeletal:        General: Normal range of motion.     Cervical back: Normal range of motion and neck supple.     Right lower leg: No edema.     Left lower leg: No edema.  Lymphadenopathy:     Cervical: No cervical adenopathy.  Skin:    General: Skin is warm and dry.     Findings: No erythema or rash.  Neurological:     Mental Status: She is alert and oriented to person, place, and time.     Cranial Nerves: No cranial nerve deficit.     Deep Tendon Reflexes: Reflexes are normal and symmetric.  Psychiatric:        Mood and Affect: Mood normal.        Behavior: Behavior normal.        Thought Content: Thought content normal.        Judgment: Judgment normal.      Results for orders placed or performed in visit on 11/15/23  HM PAP SMEAR  Result Value Ref Range   HM Pap smear Normal     Last CBC Lab Results  Component Value Date   WBC 4.6 11/13/2022   HGB 11.7 11/13/2022   HCT 36.3 11/13/2022   MCV 87 11/13/2022   MCH 27.9 11/13/2022   RDW 13.8 11/13/2022   PLT 313 11/13/2022   Last metabolic panel Lab Results  Component Value Date   GLUCOSE 78 11/13/2022   NA 138 11/13/2022   K 4.6 11/13/2022   CL 103 11/13/2022   CO2 18 (L) 11/13/2022   BUN 16 11/13/2022   CREATININE 0.87 11/13/2022   EGFR 86 11/13/2022   CALCIUM 9.3 11/13/2022   PROT 6.7 11/13/2022   ALBUMIN 4.5 11/13/2022  LABGLOB 2.2 11/13/2022   AGRATIO 2.0 11/13/2022   BILITOT 0.3 11/13/2022   ALKPHOS 66 11/13/2022   AST 14 11/13/2022   ALT 10 11/13/2022   Last lipids Lab Results  Component Value Date   CHOL 181 11/13/2022   HDL 77 11/13/2022   LDLCALC 89 11/13/2022   TRIG 80 11/13/2022   CHOLHDL 2.4 11/13/2022   Last hemoglobin A1c Lab Results  Component Value Date   HGBA1C 5.4 06/16/2018   Last thyroid functions Lab Results  Component Value Date   TSH 2.530 11/13/2022   T3TOTAL 108 06/16/2018   Last vitamin D Lab Results  Component Value Date   VD25OH 24.2 (L)  06/16/2018   Last vitamin B12 and Folate Lab Results  Component Value Date   VITAMINB12 1,775 (H) 06/16/2018   FOLATE >20.0 06/16/2018    The 10-year ASCVD risk score (Arnett DK, et al., 2019) is: 0.2%    Assessment & Plan:   Problem List Items Addressed This Visit       Unprioritized   Depression with anxiety   Relevant Medications   buPROPion (WELLBUTRIN XL) 300 MG 24 hr tablet   Preventative health care - Primary   Ghm utd Check labs  See AVS  Health Maintenance  Topic Date Due   COVID-19 Vaccine (2 - Mixed Product risk series) 12/01/2023 (Originally 11/02/2019)   HIV Screening  11/14/2024 (Originally 01/07/1997)   DTaP/Tdap/Td (4 - Td or Tdap) 04/28/2024   Cervical Cancer Screening (HPV/Pap Cotest)  04/28/2026   INFLUENZA VACCINE  Completed   Hepatitis C Screening  Completed   HPV VACCINES  Aged Out         Relevant Orders   CBC with Differential/Platelet   Comprehensive metabolic panel   Lipid panel   TSH   Attention deficit disorder   Stable  Database reviewed  Uds and contract utd       Other Visit Diagnoses       Other migraine without status migrainosus, intractable       Relevant Medications   SUMAtriptan (IMITREX) 50 MG tablet   buPROPion (WELLBUTRIN XL) 300 MG 24 hr tablet     Assessment and Plan    Hypertension Blood pressure was elevated at 140/100 mmHg, compared to usual readings of 120/70 mmHg. Increased stress from work and school may be contributing, along with possible effects from Wellbutrin. Discussed risks of untreated hypertension, such as stroke and heart attack, and advised lifestyle modifications. Recheck blood pressure manually after relaxation. Reduce salt and caffeine intake, ensure adequate sleep, and manage stress. Monitor blood pressure and consider treatment if elevated readings persist.  Hysterectomy (planned) A hysterectomy is scheduled due to concerns about C-section scarring, with a microscopic surgical approach and an  expected two-week recovery. Alternatives like ablation were discussed, but the decision was made to proceed with hysterectomy to avoid future surgery. Discussed post-surgery pain management and pausing Concerta during recovery. Coordinate with the surgeon regarding pain management and pause Concerta during recovery.  Musculoskeletal pain Significant knot in the shoulder area was reported, previously treated by a chiropractor. Discussed dry needling benefits, and there is interest despite lack of insurance coverage. Recommend dry needling therapy with Dr. Genevie Ann.  Migraine Currently on Imitrex for migraine management with no new issues. Refill Imitrex prescription.  Depression On Wellbutrin for depression management with no new issues. Refill Wellbutrin prescription.  General Health Maintenance Discussed COVID-19 vaccination status and current recommendations. No immediate need for additional vaccination.  Follow-up Recheck blood pressure  manually during the visit and monitor at home. Follow up with primary care provider as needed.         Return in about 6 months (around 05/14/2024), or if symptoms worsen or fail to improve.    Donato Schultz, DO

## 2023-11-15 NOTE — Assessment & Plan Note (Signed)
Stable  Database reviewed  Uds and contract utd

## 2023-11-15 NOTE — Patient Instructions (Signed)
 Preventive Care 16-42 Years Old, Female  Preventive care refers to lifestyle choices and visits with your health care provider that can promote health and wellness. Preventive care visits are also called wellness exams.  What can I expect for my preventive care visit?  Counseling  Your health care provider may ask you questions about your:  Medical history, including:  Past medical problems.  Family medical history.  Pregnancy history.  Current health, including:  Menstrual cycle.  Method of birth control.  Emotional well-being.  Home life and relationship well-being.  Sexual activity and sexual health.  Lifestyle, including:  Alcohol, nicotine or tobacco, and drug use.  Access to firearms.  Diet, exercise, and sleep habits.  Work and work Astronomer.  Sunscreen use.  Safety issues such as seatbelt and bike helmet use.  Physical exam  Your health care provider will check your:  Height and weight. These may be used to calculate your BMI (body mass index). BMI is a measurement that tells if you are at a healthy weight.  Waist circumference. This measures the distance around your waistline. This measurement also tells if you are at a healthy weight and may help predict your risk of certain diseases, such as type 2 diabetes and high blood pressure.  Heart rate and blood pressure.  Body temperature.  Skin for abnormal spots.  What immunizations do I need?    Vaccines are usually given at various ages, according to a schedule. Your health care provider will recommend vaccines for you based on your age, medical history, and lifestyle or other factors, such as travel or where you work.  What tests do I need?  Screening  Your health care provider may recommend screening tests for certain conditions. This may include:  Lipid and cholesterol levels.  Diabetes screening. This is done by checking your blood sugar (glucose) after you have not eaten for a while (fasting).  Pelvic exam and Pap test.  Hepatitis B test.  Hepatitis C  test.  HIV (human immunodeficiency virus) test.  STI (sexually transmitted infection) testing, if you are at risk.  Lung cancer screening.  Colorectal cancer screening.  Mammogram. Talk with your health care provider about when you should start having regular mammograms. This may depend on whether you have a family history of breast cancer.  BRCA-related cancer screening. This may be done if you have a family history of breast, ovarian, tubal, or peritoneal cancers.  Bone density scan. This is done to screen for osteoporosis.  Talk with your health care provider about your test results, treatment options, and if necessary, the need for more tests.  Follow these instructions at home:  Eating and drinking    Eat a diet that includes fresh fruits and vegetables, whole grains, lean protein, and low-fat dairy products.  Take vitamin and mineral supplements as recommended by your health care provider.  Do not drink alcohol if:  Your health care provider tells you not to drink.  You are pregnant, may be pregnant, or are planning to become pregnant.  If you drink alcohol:  Limit how much you have to 0-1 drink a day.  Know how much alcohol is in your drink. In the [place] one drink equals one 12 oz bottle of beer (355 mL), one 5 oz glass of wine (148 mL), or one 1 oz glass of hard liquor (44 mL).  Lifestyle  Brush your teeth every morning and night with fluoride toothpaste. Floss one time each day.  Exercise for at least  30 minutes 5 or more days each week.  Do not use any products that contain nicotine or tobacco. These products include cigarettes, chewing tobacco, and vaping devices, such as e-cigarettes. If you need help quitting, ask your health care provider.  Do not use drugs.  If you are sexually active, practice safe sex. Use a condom or other form of protection to prevent STIs.  If you do not wish to become pregnant, use a form of birth control. If you plan to become pregnant, see your health care provider for a  prepregnancy visit.  Take aspirin only as told by your health care provider. Make sure that you understand how much to take and what form to take. Work with your health care provider to find out whether it is safe and beneficial for you to take aspirin daily.  Find healthy ways to manage stress, such as:  Meditation, yoga, or listening to music.  Journaling.  Talking to a trusted person.  Spending time with friends and family.  Minimize exposure to UV radiation to reduce your risk of skin cancer.  Safety  Always wear your seat belt while driving or riding in a vehicle.  Do not drive:  If you have been drinking alcohol. Do not ride with someone who has been drinking.  When you are tired or distracted.  While texting.  If you have been using any mind-altering substances or drugs.  Wear a helmet and other protective equipment during sports activities.  If you have firearms in your house, make sure you follow all gun safety procedures.  Seek help if you have been physically or sexually abused.  What's next?  Visit your health care provider once a year for an annual wellness visit.  Ask your health care provider how often you should have your eyes and teeth checked.  Stay up to date on all vaccines.  This information is not intended to replace advice given to you by your health care provider. Make sure you discuss any questions you have with your health care provider.  Document Revised: 03/12/2021 Document Reviewed: 03/12/2021  Elsevier Patient Education  2024 [employer].

## 2023-11-18 ENCOUNTER — Encounter: Payer: Self-pay | Admitting: Family Medicine

## 2023-12-20 NOTE — Pre-Procedure Instructions (Signed)
 Surgical Instructions   Your procedure is scheduled on December 30, 2023. Report to [facility] Main Entrance "A" at 5:30 A.M., then check in with the Admitting office. Any questions or running late day of surgery: call [phone]  Questions prior to your surgery date: call [phone], Monday-Friday, 8am-4pm. If you experience any cold or flu symptoms such as cough, fever, chills, shortness of breath, etc. between now and your scheduled surgery, please notify us at the above number.     Remember:  Do not eat after midnight the night before your surgery   You may drink clear liquids until 4:30 AM the morning of your surgery.   Clear liquids allowed are: Water, Non-Citrus Juices (without pulp), Carbonated Beverages, Clear Tea (no milk, honey, etc.), Black Coffee Only (NO MILK, CREAM OR POWDERED CREAMER of any kind), and Gatorade.    Take these medicines the morning of surgery with A SIP OF WATER: buPROPion (WELLBUTRIN XL)  cetirizine (ZYRTEC)    May take these medicines IF NEEDED: SUMAtriptan (IMITREX)    One week prior to surgery, STOP taking any Aspirin (unless otherwise instructed by your surgeon) Aleve, Naproxen, Ibuprofen, Motrin, Advil, Goody's, BC's, all herbal medications, fish oil, and non-prescription vitamins.                     Do NOT Smoke (Tobacco/Vaping) for 24 hours prior to your procedure.  If you use a CPAP at night, you may bring your mask/headgear for your overnight stay.   You will be asked to remove any contacts, glasses, piercing's, hearing aid's, dentures/partials prior to surgery. Please bring cases for these items if needed.    Patients discharged the day of surgery will not be allowed to drive home, and someone needs to stay with them for 24 hours.  SURGICAL WAITING ROOM VISITATION Patients may have no more than 2 support people in the waiting area - these visitors may rotate.   Pre-op nurse will coordinate an appropriate time for 1 ADULT support  person, who may not rotate, to accompany patient in pre-op.  Children under the age of 38 must have an adult with them who is not the patient and must remain in the main waiting area with an adult.  If the patient needs to stay at the hospital during part of their recovery, the visitor guidelines for inpatient rooms apply.  Please refer to the [facility] website for the visitor guidelines for any additional information.   If you received a COVID test during your pre-op visit  it is requested that you wear a mask when out in public, stay away from anyone that may not be feeling well and notify your surgeon if you develop symptoms. If you have been in contact with anyone that has tested positive in the last 10 days please notify you surgeon.      Pre-operative CHG Bathing Instructions   You can play a key role in reducing the risk of infection after surgery. Your skin needs to be as free of germs as possible. You can reduce the number of germs on your skin by washing with CHG (chlorhexidine gluconate) soap before surgery. CHG is an antiseptic soap that kills germs and continues to kill germs even after washing.   DO NOT use if you have an allergy to chlorhexidine/CHG or antibacterial soaps. If your skin becomes reddened or irritated, stop using the CHG and notify one of our RNs at [phone].  TAKE A SHOWER THE NIGHT BEFORE SURGERY AND THE DAY OF SURGERY    Please keep in mind the following:  DO NOT shave, including legs and underarms, 48 hours prior to surgery.   You may shave your face before/day of surgery.  Place clean sheets on your bed the night before surgery Use a clean washcloth (not used since being washed) for each shower. DO NOT sleep with pet's night before surgery.  CHG Shower Instructions:  Wash your face and private area with normal soap. If you choose to wash your hair, wash first with your normal shampoo.  After you use shampoo/soap, rinse your hair and  body thoroughly to remove shampoo/soap residue.  Turn the water OFF and apply half the bottle of CHG soap to a CLEAN washcloth.  Apply CHG soap ONLY FROM YOUR NECK DOWN TO YOUR TOES (washing for 3-5 minutes)  DO NOT use CHG soap on face, private areas, open wounds, or sores.  Pay special attention to the area where your surgery is being performed.  If you are having back surgery, having someone wash your back for you may be helpful. Wait 2 minutes after CHG soap is applied, then you may rinse off the CHG soap.  Pat dry with a clean towel  Put on clean pajamas    Additional instructions for the day of surgery: DO NOT APPLY any lotions, deodorants, cologne, or perfumes.   Do not wear jewelry or makeup Do not wear nail polish, gel polish, artificial nails, or any other type of covering on natural nails (fingers and toes) Do not bring valuables to the hospital. [facility] is not responsible for valuables/personal belongings. Put on clean/comfortable clothes.  Please brush your teeth.  Ask your nurse before applying any prescription medications to the skin.

## 2023-12-21 ENCOUNTER — Encounter (HOSPITAL_COMMUNITY): Payer: Self-pay

## 2023-12-21 ENCOUNTER — Encounter (HOSPITAL_COMMUNITY)
Admission: RE | Admit: 2023-12-21 | Discharge: 2023-12-21 | Disposition: A | Discharge status: Home / Self Care | Payer: PRIVATE HEALTH INSURANCE | Source: Ambulatory Visit | Attending: Obstetrics and Gynecology | Admitting: Obstetrics and Gynecology

## 2023-12-21 ENCOUNTER — Other Ambulatory Visit: Payer: Self-pay

## 2023-12-21 VITALS — BP 130/94 | HR 86 | Temp 98.5°F | Resp 17 | Ht 66.0 in | Wt 181.5 lb

## 2023-12-21 DIAGNOSIS — I2510 Atherosclerotic heart disease of native coronary artery without angina pectoris: Secondary | ICD-10-CM | POA: Diagnosis not present

## 2023-12-21 DIAGNOSIS — Z01818 Encounter for other preprocedural examination: Secondary | ICD-10-CM | POA: Insufficient documentation

## 2023-12-21 DIAGNOSIS — N939 Abnormal uterine and vaginal bleeding, unspecified: Secondary | ICD-10-CM | POA: Insufficient documentation

## 2023-12-21 LAB — BASIC METABOLIC PANEL
Anion gap: 7 (ref 5–15)
BUN: 14 mg/dL (ref 6–20)
CO2: 25 mmol/L (ref 22–32)
Calcium: 9.2 mg/dL (ref 8.9–10.3)
Chloride: 105 mmol/L (ref 98–111)
Creatinine, Ser: 0.93 mg/dL (ref 0.44–1.00)
GFR, Estimated: >60 mL/min (ref >60)
Glucose, Bld: 91 mg/dL (ref 70–99)
Potassium: 4 mmol/L (ref 3.5–5.1)
Sodium: 137 mmol/L (ref 135–145)

## 2023-12-21 LAB — CBC
HCT: 40.4 % (ref 36.0–46.0)
Hemoglobin: 13.2 g/dL (ref 12.0–15.0)
MCH: 31.2 pg (ref 26.0–34.0)
MCHC: 32.7 g/dL (ref 30.0–36.0)
MCV: 95.5 fL (ref 80.0–100.0)
Platelets: 287 10*3/uL (ref 150–400)
RBC: 4.23 MIL/uL (ref 3.87–5.11)
RDW: 12.3 % (ref 11.5–15.5)
WBC: 4.7 10*3/uL (ref 4.0–10.5)
nRBC: 0 % (ref 0.0–0.2)

## 2023-12-21 LAB — TYPE AND SCREEN
ABO/RH(D): O NEG
Antibody Screen: NEGATIVE

## 2023-12-21 NOTE — Progress Notes (Signed)
 PCP - Seabron Spates, MD Cardiologist - Denies  PPM/ICD - Denies Device Orders - n/a Rep Notified - n/a  Chest x-ray - n/a EKG - 12/21/2023 Stress Test - Denies ECHO - Denies Cardiac Cath - Denies  Sleep Study - Denies CPAP - n/a  No DM  Last dose of GLP1 agonist- n/a GLP1 instructions: n/a  Blood Thinner Instructions: n/a Aspirin Instructions: n/a  ERAS Protcol - Clear liquids until 0430 morning of surgery PRE-SURGERY Ensure or G2- n/a  COVID TEST- n/a   Anesthesia review: Yes. EKG review   Patient denies shortness of breath, fever, cough and chest pain at PAT appointment. Pt denies any respiratory illness/infection in the last two months.   All instructions explained to the patient, with a verbal understanding of the material. Patient agrees to go over the instructions while at home for a better understanding. Patient also instructed to self quarantine after being tested for COVID-19. The opportunity to ask questions was provided.

## 2023-12-29 NOTE — H&P (Signed)
 Preoperative Preadmission History and Physical  [name] is an 43 y.o. female. G1P1001 here for scheduled surgery. She has had AUB managed medically, scheduled for total laparoscopic hysterectomy, bilateral salpingectomy. Recent TVUS 10/21/2023: UTERUS WITH SUBSEROSAL FIBROIDS = 24 X 20 MM, 25 X 21 MM, 10 MM, endometrial stripe 2mm with IUD in place.  Pertinent Gynecological History: Menses: heavy menses intermittently, regular cycles Bleeding: dysfunctional uterine bleeding Contraception: IUD DES exposure: unknown Blood transfusions: none Sexually transmitted diseases: no past history Previous GYN Procedures:  none, has had cesarean section x1   Last mammogram: s/p US guided biopsy of R breast last year with intraductal papilloma Last pap: normal Date: 04/2022 OB History: G1, P1   Menstrual History: Patient's last menstrual period was 11/28/2023.    Past Medical History:  Diagnosis Date   ADHD    Anemia    Anxiety    Anxiety and depression    History of gestational hypertension    Leukopenia    Pre-eclampsia    2010    Past Surgical History:  Procedure Laterality Date   BREAST BIOPSY Right    2023   CESAREAN SECTION     2010   EYE SURGERY  12/25/2016   Lasix Eye Surgery    Family History  Problem Relation Age of Onset   Hypertension Father    Diabetes Father        Type 2   Hyperlipidemia Father    Lung cancer Maternal Aunt    Liver cancer Maternal Aunt    Heart disease Paternal Grandmother    Pancreatic cancer Paternal Grandfather     Social History:  reports that she has never smoked. She has never used smokeless tobacco. She reports that she does not drink alcohol and does not use drugs.  Allergies:  Allergies  Allergen Reactions   Sulfonamide Derivatives Swelling and Rash    No medications prior to admission.   ROS otherwise negative     12/21/2023    8:35 AM 11/15/2023   10:03 AM 11/15/2023    9:28 AM  Vitals with BMI  Height 5\' 6"    5\' 6"   Weight 181 lbs 8 oz  177 lbs 6 oz  BMI 29.31  28.65  Systolic 130 110 829  Diastolic 94 80 88  Pulse 86  86   Last menstrual period 11/28/2023. Physical exam from office  Chaperone Chaperone: present  Constitutional *General Appearance: healthy-appearing, well-nourished, well-developed  Head Head: normocephalic  Neck *Thyroid: no enlargement, no nodules, non-tender  *Breast Bilateral: no skin changes, nipple appearance: normal, no abnormal nipple secretions, no tenderness, no masses palpable Right Breast: normal Left Breast: normal  Abdomen *Inspection/Palpation/Auscultation: non-distended, no tenderness, no rebound, no guarding, soft *Hernia: none palpated  Female Genitalia Vulva: no masses, no atrophy, no lesions Mons: normal, no erythema, no excoriation, no atrophy, no lesions, no vesicles/ ulcers, no masses, no swelling, no tenderness Labia Majora: normal, no erythema, no excoriation, no atrophy, no discoloration, no lesions, no vesicles/ ulcers, no masses, no swelling, no tenderness Labia Minora: normal, no erythema, no excoriation, no atrophy, no discoloration, no lesions, no vesicles, no masses, no swelling, no tenderness Introitus: normal Bartholin's Gland: normal *Vagina: normal, no discharge, no blood present, no erythema, no atrophy, no lesions, no ulcers, no swelling, no masses, no tenderness, no prolapse Vaginal discharge no abnormal discharge present *Cervix: no lesions, no discharge, no bleeding, no cervical motion tenderness, IUD strings not seen *Uterus: normal size, normal contour, midline, no uterine prolapse, mobile,  non-tender *Urethral Meatus/ Urethra: normal meatus, no discharge, well supported urethra, no masses, no tenderness *Bladder: non-distended, no palpable mass, non-tender *Adnexa/Parametria: no mass palpable, no tenderness  Rectum *Anus & Perineum: normal perianal skin, no anal fissure, no hemorrhoids, normal  perineum  Extremities Legs: normal Arms: normal Pulses: normal  Skin *Appearance: no rashes, no lesions  Neurological System Impressions: motor: no deficits, sensory: no deficits  Psychiatric *Orientation: to person, to place, to time *Mood and Affect: active and alert, normal mood, normal affect   Recent Results (from the past 2160 hours)  CBC with Differential/Platelet     Status: None   Collection Time: 11/15/23 10:18 AM  Result Value Ref Range   WBC 4.0 4.0 - 10.5 K/uL   RBC 4.30 3.87 - 5.11 Mil/uL   Hemoglobin 13.5 12.0 - 15.0 g/dL   HCT 16.1 09.6 - 04.5 %   MCV 93.2 78.0 - 100.0 fl   MCHC 33.8 30.0 - 36.0 g/dL   RDW 40.9 81.1 - 91.4 %   Platelets 304.0 150.0 - 400.0 K/uL   Neutrophils Relative % 58.2 43.0 - 77.0 %   Lymphocytes Relative 33.4 12.0 - 46.0 %   Monocytes Relative 5.6 3.0 - 12.0 %   Eosinophils Relative 2.1 0.0 - 5.0 %   Basophils Relative 0.7 0.0 - 3.0 %   Neutro Abs 2.3 1.4 - 7.7 K/uL   Lymphs Abs 1.3 0.7 - 4.0 K/uL   Monocytes Absolute 0.2 0.1 - 1.0 K/uL   Eosinophils Absolute 0.1 0.0 - 0.7 K/uL   Basophils Absolute 0.0 0.0 - 0.1 K/uL  Comprehensive metabolic panel     Status: None   Collection Time: 11/15/23 10:18 AM  Result Value Ref Range   Sodium 137 135 - 145 mEq/L   Potassium 4.5 3.5 - 5.1 mEq/L   Chloride 103 96 - 112 mEq/L   CO2 26 19 - 32 mEq/L   Glucose, Bld 90 70 - 99 mg/dL   BUN 15 6 - 23 mg/dL   Creatinine, Ser 7.82 0.40 - 1.20 mg/dL   Total Bilirubin 0.5 0.2 - 1.2 mg/dL   Alkaline Phosphatase 55 39 - 117 U/L   AST 11 0 - 37 U/L   ALT 10 0 - 35 U/L   Total Protein 6.6 6.0 - 8.3 g/dL   Albumin 4.4 3.5 - 5.2 g/dL   GFR 95.62 >13.08 mL/min    Comment: Calculated using the CKD-EPI Creatinine Equation (2021)   Calcium 8.9 8.4 - 10.5 mg/dL  Lipid panel     Status: None   Collection Time: 11/15/23 10:18 AM  Result Value Ref Range   Cholesterol 171 0 - 200 mg/dL    Comment: ATP III Classification       Desirable:  < 200 mg/dL                Borderline High:  200 - 239 mg/dL          High:  > = 657 mg/dL   Triglycerides 84.6 0.0 - 149.0 mg/dL    Comment: Normal:  <962 mg/dLBorderline High:  150 - 199 mg/dL   HDL 95.28 >41.32 mg/dL   VLDL 44.0 0.0 - 10.2 mg/dL   LDL Cholesterol 88 0 - 99 mg/dL   Total CHOL/HDL Ratio 3     Comment:                Men          Women1/2 Average Risk  3.4          3.3Average Risk          5.0          4.42X Average Risk          9.6          7.13X Average Risk          15.0          11.0                       NonHDL 105.39     Comment: NOTE:  Non-HDL goal should be 30 mg/dL higher than patient's LDL goal (i.e. LDL goal of < 70 mg/dL, would have non-HDL goal of < 100 mg/dL)  TSH     Status: None   Collection Time: 11/15/23 10:18 AM  Result Value Ref Range   TSH 1.81 0.35 - 5.50 uIU/mL  CBC     Status: None   Collection Time: 12/21/23  9:05 AM  Result Value Ref Range   WBC 4.7 4.0 - 10.5 K/uL   RBC 4.23 3.87 - 5.11 MIL/uL   Hemoglobin 13.2 12.0 - 15.0 g/dL   HCT 40.9 81.1 - 91.4 %   MCV 95.5 80.0 - 100.0 fL   MCH 31.2 26.0 - 34.0 pg   MCHC 32.7 30.0 - 36.0 g/dL   RDW 78.2 95.6 - 21.3 %   Platelets 287 150 - 400 K/uL   nRBC 0.0 0.0 - 0.2 %    Comment: Performed at [facility], [address] [place], [place] [address]  Type and screen [facility]     Status: None   Collection Time: 12/21/23  9:05 AM  Result Value Ref Range   ABO/RH(D) O NEG    Antibody Screen NEG    Sample Expiration 01/04/2024,2359    Extend sample reason      NO TRANSFUSIONS OR PREGNANCY IN THE PAST 3 MONTHS Performed at [facility], [address] [place], [place] [address]   Basic metabolic panel per protocol     Status: None   Collection Time: 12/21/23  9:05 AM  Result Value Ref Range   Sodium 137 135 - 145 mmol/L   Potassium 4.0 3.5 - 5.1 mmol/L   Chloride 105 98 - 111 mmol/L   CO2 25 22 - 32 mmol/L   Glucose, Bld 91 70 - 99 mg/dL    Comment: Glucose reference  range applies only to samples taken after fasting for at least 8 hours.   BUN 14 6 - 20 mg/dL   Creatinine, Ser 2.95 0.44 - 1.00 mg/dL   Calcium 9.2 8.9 - 28.4 mg/dL   GFR, Estimated >13 >24 mL/min    Comment: (NOTE) Calculated using the CKD-EPI Creatinine Equation (2021)    Anion gap 7 5 - 15    Comment: Performed at [facility], [address] [place], [place] [address]    Assessment/Plan: Lekeisha Arenas is an 43 y.o. female. G1P1001 here for scheduled total laparoscopic hysterectomy, bilateral salpingectomy for management of AUB-L. Kyleena IUD in place, will be removed with uterus.  We reviewed alternative treatment options, including other medical vs. surgical management ([place], ablation, hyst). After counseling, she opted for hysterectomy. She understands that any laparoscopic procedure has risk of needing to be performed open if difficulty controlling bleeding or visualizing structures. R/b/a discussed with patient. She understands risks of bleeding, infection, and  injury to surrounding structures. We reviewed recovery in detail. Plan for discharge from PACU. Postoperative visit in 1-2 weeks.  Tawni Levy 12/29/2023, 1:56 PM

## 2023-12-29 NOTE — Anesthesia Preprocedure Evaluation (Addendum)
 Anesthesia Evaluation  Patient identified by MRN, date of birth, ID band Patient awake    Reviewed: Allergy & Precautions, NPO status , Patient's Chart, lab work & pertinent test results  Airway Mallampati: I  TM Distance: >3 FB Neck ROM: Full    Dental  (+) Teeth Intact, Dental Advisory Given Upper and lower braces :   Pulmonary neg pulmonary ROS   Pulmonary exam normal breath sounds clear to auscultation       Cardiovascular Normal cardiovascular exam Rhythm:Regular Rate:Normal     Neuro/Psych  Headaches PSYCHIATRIC DISORDERS Anxiety Depression       GI/Hepatic negative GI ROS, Neg liver ROS,,,  Endo/Other  negative endocrine ROS    Renal/GU negative Renal ROS     Musculoskeletal negative musculoskeletal ROS (+)    Abdominal   Peds  (+) ADHD Hematology  (+) Blood dyscrasia, anemia   Anesthesia Other Findings   Reproductive/Obstetrics Fibroids                              Anesthesia Physical Anesthesia Plan  ASA: 2  Anesthesia Plan: General   Post-op Pain Management: Tylenol PO (pre-op)*, Gabapentin PO (pre-op)* and Celebrex PO (pre-op)*   Induction: Intravenous  PONV Risk Score and Plan: 4 or greater and Scopolamine patch - Pre-op, Midazolam, Dexamethasone and Ondansetron  Airway Management Planned: Oral ETT  Additional Equipment:   Intra-op Plan:   Post-operative Plan: Extubation in OR  Informed Consent: I have reviewed the patients History and Physical, chart, labs and discussed the procedure including the risks, benefits and alternatives for the proposed anesthesia with the patient or authorized representative who has indicated his/her understanding and acceptance.     Dental advisory given  Plan Discussed with: CRNA  Anesthesia Plan Comments:        Anesthesia Quick Evaluation

## 2023-12-30 ENCOUNTER — Other Ambulatory Visit: Payer: Self-pay

## 2023-12-30 ENCOUNTER — Ambulatory Visit (HOSPITAL_COMMUNITY)
Admission: RE | Admit: 2023-12-30 | Discharge: 2023-12-31 | Disposition: A | Discharge status: Home / Self Care | Payer: PRIVATE HEALTH INSURANCE | Attending: Obstetrics and Gynecology | Admitting: Obstetrics and Gynecology

## 2023-12-30 ENCOUNTER — Ambulatory Visit (HOSPITAL_COMMUNITY): Payer: Self-pay | Admitting: Anesthesiology

## 2023-12-30 ENCOUNTER — Ambulatory Visit (HOSPITAL_BASED_OUTPATIENT_CLINIC_OR_DEPARTMENT_OTHER): Payer: PRIVATE HEALTH INSURANCE | Admitting: Anesthesiology

## 2023-12-30 ENCOUNTER — Encounter (HOSPITAL_COMMUNITY)
Admission: RE | Disposition: A | Discharge status: Home / Self Care | Payer: Self-pay | Source: Home / Self Care | Attending: Obstetrics and Gynecology

## 2023-12-30 ENCOUNTER — Encounter (HOSPITAL_COMMUNITY): Payer: Self-pay | Admitting: Obstetrics and Gynecology

## 2023-12-30 DIAGNOSIS — N938 Other specified abnormal uterine and vaginal bleeding: Secondary | ICD-10-CM | POA: Diagnosis present

## 2023-12-30 DIAGNOSIS — N939 Abnormal uterine and vaginal bleeding, unspecified: Secondary | ICD-10-CM

## 2023-12-30 DIAGNOSIS — D649 Anemia, unspecified: Secondary | ICD-10-CM | POA: Diagnosis not present

## 2023-12-30 DIAGNOSIS — F419 Anxiety disorder, unspecified: Secondary | ICD-10-CM | POA: Insufficient documentation

## 2023-12-30 DIAGNOSIS — D251 Intramural leiomyoma of uterus: Secondary | ICD-10-CM | POA: Diagnosis not present

## 2023-12-30 DIAGNOSIS — N72 Inflammatory disease of cervix uteri: Secondary | ICD-10-CM | POA: Diagnosis not present

## 2023-12-30 DIAGNOSIS — N8003 Adenomyosis of the uterus: Secondary | ICD-10-CM | POA: Diagnosis not present

## 2023-12-30 DIAGNOSIS — G8918 Other acute postprocedural pain: Secondary | ICD-10-CM | POA: Diagnosis not present

## 2023-12-30 DIAGNOSIS — F32A Depression, unspecified: Secondary | ICD-10-CM | POA: Insufficient documentation

## 2023-12-30 DIAGNOSIS — N736 Female pelvic peritoneal adhesions (postinfective): Secondary | ICD-10-CM | POA: Diagnosis not present

## 2023-12-30 DIAGNOSIS — F909 Attention-deficit hyperactivity disorder, unspecified type: Secondary | ICD-10-CM | POA: Diagnosis not present

## 2023-12-30 DIAGNOSIS — R519 Headache, unspecified: Secondary | ICD-10-CM | POA: Diagnosis not present

## 2023-12-30 DIAGNOSIS — D252 Subserosal leiomyoma of uterus: Secondary | ICD-10-CM | POA: Diagnosis not present

## 2023-12-30 DIAGNOSIS — N7011 Chronic salpingitis: Secondary | ICD-10-CM | POA: Diagnosis not present

## 2023-12-30 DIAGNOSIS — D259 Leiomyoma of uterus, unspecified: Secondary | ICD-10-CM

## 2023-12-30 DIAGNOSIS — Z90710 Acquired absence of both cervix and uterus: Secondary | ICD-10-CM | POA: Diagnosis present

## 2023-12-30 HISTORY — PX: TOTAL LAPAROSCOPIC HYSTERECTOMY WITH SALPINGECTOMY: SHX6742

## 2023-12-30 LAB — POCT PREGNANCY, URINE: Preg Test, Ur: NEGATIVE

## 2023-12-30 LAB — ABO/RH: ABO/RH(D): O NEG

## 2023-12-30 SURGERY — HYSTERECTOMY, TOTAL, LAPAROSCOPIC, WITH SALPINGECTOMY
Anesthesia: General | Site: Vagina | Laterality: Bilateral

## 2023-12-30 MED ORDER — ACETAMINOPHEN 500 MG PO TABS: 500.0000 mg | ORAL_TABLET | Freq: Four times a day (QID) | ORAL | 1 refills | Status: DC | PRN

## 2023-12-30 MED ORDER — LACTATED RINGERS IV SOLN: INTRAVENOUS | Status: DC | PRN

## 2023-12-30 MED ORDER — ONDANSETRON HCL 4 MG/2ML IJ SOLN: 4.0000 mg | Freq: Once | INTRAMUSCULAR | Status: DC | PRN

## 2023-12-30 MED ORDER — PROPOFOL 10 MG/ML IV BOLUS
INTRAVENOUS | Status: AC
  Filled 2023-12-30: qty 20

## 2023-12-30 MED ORDER — MIDAZOLAM HCL 2 MG/2ML IJ SOLN: 2.0000 mg | Freq: Once | INTRAMUSCULAR | Status: AC

## 2023-12-30 MED ORDER — SIMETHICONE 80 MG PO CHEW
80.0000 mg | CHEWABLE_TABLET | Freq: Four times a day (QID) | ORAL | Status: DC | PRN
  Administered 2023-12-30 – 2023-12-31 (×2): 80 mg via ORAL
  Filled 2023-12-30 (×2): qty 1

## 2023-12-30 MED ORDER — HYDROMORPHONE HCL 1 MG/ML IJ SOLN: 0.2000 mg | INTRAMUSCULAR | Status: DC | PRN

## 2023-12-30 MED ORDER — OXYCODONE HCL 5 MG PO TABS
5.0000 mg | ORAL_TABLET | ORAL | Status: DC | PRN
  Administered 2023-12-31: 10 mg via ORAL
  Filled 2023-12-30: qty 2

## 2023-12-30 MED ORDER — SODIUM CHLORIDE (PF) 0.9 % IJ SOLN
INTRAMUSCULAR | Status: AC
  Filled 2023-12-30: qty 50

## 2023-12-30 MED ORDER — IBUPROFEN 600 MG PO TABS: 600.0000 mg | ORAL_TABLET | Freq: Four times a day (QID) | ORAL | 1 refills | Status: DC | PRN

## 2023-12-30 MED ORDER — DEXAMETHASONE SODIUM PHOSPHATE 10 MG/ML IJ SOLN
INTRAMUSCULAR | Status: DC | PRN
  Administered 2023-12-30: 10 mg via INTRAVENOUS

## 2023-12-30 MED ORDER — FENTANYL CITRATE (PF) 250 MCG/5ML IJ SOLN
INTRAMUSCULAR | Status: AC
Start: 2023-12-30 — End: ?
  Filled 2023-12-30: qty 5

## 2023-12-30 MED ORDER — SCOPOLAMINE 1 MG/3DAYS TD PT72
1.0000 | MEDICATED_PATCH | Freq: Once | TRANSDERMAL | Status: DC
  Administered 2023-12-30: 1.5 mg via TRANSDERMAL
  Filled 2023-12-30: qty 1

## 2023-12-30 MED ORDER — GABAPENTIN 300 MG PO CAPS
300.0000 mg | ORAL_CAPSULE | ORAL | Status: AC
  Administered 2023-12-30: 300 mg via ORAL
  Filled 2023-12-30: qty 1

## 2023-12-30 MED ORDER — FENTANYL CITRATE (PF) 100 MCG/2ML IJ SOLN
INTRAMUSCULAR | Status: AC
  Filled 2023-12-30: qty 2

## 2023-12-30 MED ORDER — PHENAZOPYRIDINE HCL 100 MG PO TABS
200.0000 mg | ORAL_TABLET | Freq: Once | ORAL | Status: AC
  Administered 2023-12-30: 200 mg via ORAL

## 2023-12-30 MED ORDER — 0.9 % SODIUM CHLORIDE (POUR BTL) OPTIME
TOPICAL | Status: DC | PRN
  Administered 2023-12-30: 1000 mL

## 2023-12-30 MED ORDER — HYDROMORPHONE HCL 1 MG/ML IJ SOLN
INTRAMUSCULAR | Status: AC
  Filled 2023-12-30: qty 1

## 2023-12-30 MED ORDER — ONDANSETRON HCL 4 MG PO TABS
4.0000 mg | ORAL_TABLET | Freq: Four times a day (QID) | ORAL | Status: DC | PRN
  Filled 2023-12-30: qty 1

## 2023-12-30 MED ORDER — BUPIVACAINE HCL (PF) 0.25 % IJ SOLN
INTRAMUSCULAR | Status: DC | PRN
  Administered 2023-12-30 (×2): 20 mL via PERINEURAL

## 2023-12-30 MED ORDER — OXYCODONE HCL 5 MG PO TABS: 5.0000 mg | ORAL_TABLET | ORAL | 0 refills | Status: AC | PRN

## 2023-12-30 MED ORDER — CEFAZOLIN SODIUM-DEXTROSE 2-4 GM/100ML-% IV SOLN
2.0000 g | INTRAVENOUS | Status: AC
  Administered 2023-12-30: 2 g via INTRAVENOUS
  Filled 2023-12-30: qty 100

## 2023-12-30 MED ORDER — ALUM & MAG HYDROXIDE-SIMETH 200-200-20 MG/5ML PO SUSP: 30.0000 mL | ORAL | Status: DC | PRN

## 2023-12-30 MED ORDER — DOCUSATE SODIUM 100 MG PO CAPS
100.0000 mg | ORAL_CAPSULE | Freq: Two times a day (BID) | ORAL | Status: DC
  Administered 2023-12-30 – 2023-12-31 (×2): 100 mg via ORAL
  Filled 2023-12-30 (×3): qty 1

## 2023-12-30 MED ORDER — BUPIVACAINE HCL (PF) 0.25 % IJ SOLN
INTRAMUSCULAR | Status: AC
  Filled 2023-12-30: qty 30

## 2023-12-30 MED ORDER — ACETAMINOPHEN 500 MG PO TABS
1000.0000 mg | ORAL_TABLET | Freq: Four times a day (QID) | ORAL | Status: DC
  Administered 2023-12-30 – 2023-12-31 (×4): 1000 mg via ORAL
  Filled 2023-12-30 (×5): qty 2

## 2023-12-30 MED ORDER — DEXMEDETOMIDINE HCL IN NACL 200 MCG/50ML IV SOLN
INTRAVENOUS | Status: DC | PRN
Start: 2023-12-30 — End: 2023-12-30
  Administered 2023-12-30: 4 ug via INTRAVENOUS

## 2023-12-30 MED ORDER — AMISULPRIDE (ANTIEMETIC) 5 MG/2ML IV SOLN: 10.0000 mg | Freq: Once | INTRAVENOUS | Status: DC | PRN

## 2023-12-30 MED ORDER — SODIUM CHLORIDE 0.9% FLUSH: 3.0000 mL | INTRAVENOUS | Status: DC | PRN

## 2023-12-30 MED ORDER — IBUPROFEN 600 MG PO TABS: 600.0000 mg | ORAL_TABLET | Freq: Four times a day (QID) | ORAL | 1 refills | Status: AC | PRN

## 2023-12-30 MED ORDER — ONDANSETRON HCL 4 MG/2ML IJ SOLN: 4.0000 mg | Freq: Four times a day (QID) | INTRAMUSCULAR | Status: DC | PRN

## 2023-12-30 MED ORDER — SODIUM CHLORIDE 0.9% FLUSH
3.0000 mL | Freq: Two times a day (BID) | INTRAVENOUS | Status: DC
  Administered 2023-12-31: 10 mL via INTRAVENOUS

## 2023-12-30 MED ORDER — MIDAZOLAM HCL 2 MG/2ML IJ SOLN
INTRAMUSCULAR | Status: DC | PRN
  Administered 2023-12-30: 2 mg via INTRAVENOUS

## 2023-12-30 MED ORDER — FENTANYL CITRATE (PF) 100 MCG/2ML IJ SOLN: 50.0000 ug | Freq: Once | INTRAMUSCULAR | Status: AC

## 2023-12-30 MED ORDER — FENTANYL CITRATE (PF) 250 MCG/5ML IJ SOLN
INTRAMUSCULAR | Status: DC | PRN
  Administered 2023-12-30: 100 ug via INTRAVENOUS
  Administered 2023-12-30: 25 ug via INTRAVENOUS
  Administered 2023-12-30: 50 ug via INTRAVENOUS

## 2023-12-30 MED ORDER — FENTANYL CITRATE (PF) 100 MCG/2ML IJ SOLN
25.0000 ug | INTRAMUSCULAR | Status: DC | PRN
  Administered 2023-12-30: 25 ug via INTRAVENOUS
  Administered 2023-12-30 (×2): 50 ug via INTRAVENOUS
  Administered 2023-12-30: 25 ug via INTRAVENOUS

## 2023-12-30 MED ORDER — CELECOXIB 200 MG PO CAPS
400.0000 mg | ORAL_CAPSULE | ORAL | Status: AC
  Administered 2023-12-30: 200 mg via ORAL
  Filled 2023-12-30: qty 2

## 2023-12-30 MED ORDER — PROPOFOL 500 MG/50ML IV EMUL
INTRAVENOUS | Status: DC | PRN
  Administered 2023-12-30: 200 ug/kg/min via INTRAVENOUS

## 2023-12-30 MED ORDER — FENTANYL CITRATE (PF) 100 MCG/2ML IJ SOLN
INTRAMUSCULAR | Status: AC
  Administered 2023-12-30: 50 ug via INTRAVENOUS
  Filled 2023-12-30: qty 2

## 2023-12-30 MED ORDER — SUGAMMADEX SODIUM 200 MG/2ML IV SOLN
INTRAVENOUS | Status: DC | PRN
  Administered 2023-12-30: 200 mg via INTRAVENOUS

## 2023-12-30 MED ORDER — BUPIVACAINE LIPOSOME 1.3 % IJ SUSP
INTRAMUSCULAR | Status: AC
  Filled 2023-12-30: qty 20

## 2023-12-30 MED ORDER — MIDAZOLAM HCL 2 MG/2ML IJ SOLN
INTRAMUSCULAR | Status: AC
  Filled 2023-12-30: qty 2

## 2023-12-30 MED ORDER — BUPIVACAINE LIPOSOME 1.3 % IJ SUSP
INTRAMUSCULAR | Status: DC | PRN
  Administered 2023-12-30 (×2): 10 mL via PERINEURAL

## 2023-12-30 MED ORDER — HYDROMORPHONE HCL 1 MG/ML IJ SOLN
0.2500 mg | INTRAMUSCULAR | Status: DC | PRN
  Administered 2023-12-30: 0.25 mg via INTRAVENOUS

## 2023-12-30 MED ORDER — ACETAMINOPHEN 500 MG PO TABS: 500.0000 mg | ORAL_TABLET | Freq: Four times a day (QID) | ORAL | 1 refills | Status: AC | PRN

## 2023-12-30 MED ORDER — PANTOPRAZOLE SODIUM 40 MG PO TBEC
40.0000 mg | DELAYED_RELEASE_TABLET | Freq: Every day | ORAL | Status: DC
  Administered 2023-12-30 – 2023-12-31 (×2): 40 mg via ORAL
  Filled 2023-12-30 (×2): qty 1

## 2023-12-30 MED ORDER — OXYCODONE HCL 5 MG PO TABS: 5.0000 mg | ORAL_TABLET | ORAL | 0 refills | Status: DC | PRN

## 2023-12-30 MED ORDER — PROPOFOL 10 MG/ML IV BOLUS
INTRAVENOUS | Status: DC | PRN
  Administered 2023-12-30: 160 mg via INTRAVENOUS

## 2023-12-30 MED ORDER — MIDAZOLAM HCL 2 MG/2ML IJ SOLN
INTRAMUSCULAR | Status: AC
  Administered 2023-12-30: 2 mg via INTRAVENOUS
  Filled 2023-12-30: qty 2

## 2023-12-30 MED ORDER — PHENAZOPYRIDINE HCL 100 MG PO TABS
ORAL_TABLET | ORAL | Status: AC
Start: 2023-12-30 — End: 2023-12-30
  Administered 2023-12-30: 100 mg
  Filled 2023-12-30: qty 2

## 2023-12-30 MED ORDER — ACETAMINOPHEN 500 MG PO TABS
1000.0000 mg | ORAL_TABLET | ORAL | Status: AC
  Administered 2023-12-30: 1000 mg via ORAL
  Filled 2023-12-30: qty 2

## 2023-12-30 MED ORDER — SENNOSIDES-DOCUSATE SODIUM 8.6-50 MG PO TABS: 1.0000 | ORAL_TABLET | Freq: Every day | ORAL | 1 refills | Status: DC

## 2023-12-30 MED ORDER — LACTATED RINGERS IV SOLN: INTRAVENOUS | Status: DC

## 2023-12-30 MED ORDER — ACETAMINOPHEN 500 MG PO TABS
ORAL_TABLET | ORAL | Status: AC
Start: 2023-12-30 — End: 2023-12-31
  Filled 2023-12-30: qty 2

## 2023-12-30 MED ORDER — SENNOSIDES-DOCUSATE SODIUM 8.6-50 MG PO TABS: 1.0000 | ORAL_TABLET | Freq: Every evening | ORAL | Status: DC | PRN

## 2023-12-30 MED ORDER — ONDANSETRON HCL 4 MG/2ML IJ SOLN
INTRAMUSCULAR | Status: DC | PRN
  Administered 2023-12-30: 4 mg via INTRAVENOUS

## 2023-12-30 MED ORDER — ORAL CARE MOUTH RINSE: 15.0000 mL | Freq: Once | OROMUCOSAL | Status: AC

## 2023-12-30 MED ORDER — LIDOCAINE 2% (20 MG/ML) 5 ML SYRINGE
INTRAMUSCULAR | Status: DC | PRN
  Administered 2023-12-30: 80 mg via INTRAVENOUS

## 2023-12-30 MED ORDER — SENNOSIDES-DOCUSATE SODIUM 8.6-50 MG PO TABS: 1.0000 | ORAL_TABLET | Freq: Every day | ORAL | 1 refills | Status: AC

## 2023-12-30 MED ORDER — CHLORHEXIDINE GLUCONATE 0.12 % MT SOLN
15.0000 mL | Freq: Once | OROMUCOSAL | Status: AC
  Administered 2023-12-30: 15 mL via OROMUCOSAL
  Filled 2023-12-30: qty 15

## 2023-12-30 MED ORDER — HEMOSTATIC AGENTS (NO CHARGE) OPTIME
TOPICAL | Status: DC | PRN
  Administered 2023-12-30: 1 via TOPICAL

## 2023-12-30 MED ORDER — BUPIVACAINE HCL (PF) 0.25 % IJ SOLN
INTRAMUSCULAR | Status: DC | PRN
  Administered 2023-12-30: 11 mL

## 2023-12-30 MED ORDER — POVIDONE-IODINE 10 % EX SWAB
2.0000 | Freq: Once | CUTANEOUS | Status: AC
  Administered 2023-12-30: 2 via TOPICAL

## 2023-12-30 MED ORDER — ROCURONIUM BROMIDE 10 MG/ML (PF) SYRINGE
PREFILLED_SYRINGE | INTRAVENOUS | Status: DC | PRN
  Administered 2023-12-30: 10 mg via INTRAVENOUS
  Administered 2023-12-30: 20 mg via INTRAVENOUS
  Administered 2023-12-30: 60 mg via INTRAVENOUS

## 2023-12-30 SURGICAL SUPPLY — 47 items
APPLICATOR ARISTA FLEXITIP XL (MISCELLANEOUS) IMPLANT
BENZOIN TINCTURE PRP APPL 2/3 (GAUZE/BANDAGES/DRESSINGS) IMPLANT
CABLE HIGH FREQUENCY MONO STRZ (ELECTRODE) ×1 IMPLANT
COVER MAYO STAND STRL (DRAPES) ×1 IMPLANT
COVER SURGICAL LIGHT HANDLE (MISCELLANEOUS) ×1 IMPLANT
DEFOGGER SCOPE WARMER CLEARIFY (MISCELLANEOUS) ×1 IMPLANT
DERMABOND ADVANCED .7 DNX12 (GAUZE/BANDAGES/DRESSINGS) ×1 IMPLANT
DRAPE SURG IRRIG POUCH 19X23 (DRAPES) ×1 IMPLANT
DRSG TEGADERM 2-3/8X2-3/4 SM (GAUZE/BANDAGES/DRESSINGS) IMPLANT
DURAPREP 26ML APPLICATOR (WOUND CARE) ×1 IMPLANT
GAUZE 4X4 16PLY ~~LOC~~+RFID DBL (SPONGE) IMPLANT
GAUZE SPONGE 2X2 8PLY STRL LF (GAUZE/BANDAGES/DRESSINGS) IMPLANT
GLOVE BIOGEL PI IND STRL 6 (GLOVE) ×1 IMPLANT
GLOVE SS PI 5.5 STRL (GLOVE) ×1 IMPLANT
GOWN STRL REUS W/TWL LRG LVL3 (GOWN DISPOSABLE) ×3 IMPLANT
HEMOSTAT ARISTA ABSORB 3G PWDR (HEMOSTASIS) IMPLANT
HIBICLENS CHG 4% 4OZ BTL (MISCELLANEOUS) ×2 IMPLANT
IRRIG SUCT STRYKERFLOW 2 WTIP (MISCELLANEOUS) ×1 IMPLANT
IRRIGATION SUCT STRKRFLW 2 WTP (MISCELLANEOUS) ×1 IMPLANT
KIT PINK PAD W/HEAD ARE REST (MISCELLANEOUS) ×2 IMPLANT
KIT PINK PAD W/HEAD ARM REST (MISCELLANEOUS) ×2 IMPLANT
KIT TURNOVER KIT B (KITS) ×1 IMPLANT
LIGASURE LAP L-HOOKWIRE 5 44CM (INSTRUMENTS) IMPLANT
LIGASURE VESSEL 5MM BLUNT TIP (ELECTROSURGICAL) ×1 IMPLANT
MANIPULATOR ADVINCU DEL 3.5 PL (MISCELLANEOUS) IMPLANT
MANIPULATOR VCARE LG CRV RETR (MISCELLANEOUS) IMPLANT
MANIPULATOR VCARE SML CRV RETR (MISCELLANEOUS) IMPLANT
MANIPULATOR VCARE STD CRV RETR (MISCELLANEOUS) IMPLANT
NS IRRIG 1000ML POUR BTL (IV SOLUTION) ×1 IMPLANT
OCCLUDER COLPOPNEUMO (BALLOONS) IMPLANT
PACK LAPAROSCOPY BASIN (CUSTOM PROCEDURE TRAY) ×1 IMPLANT
PENCIL BUTTON HOLSTER BLD 10FT (ELECTRODE) IMPLANT
POUCH LAPAROSCOPIC INSTRUMENT (MISCELLANEOUS) ×1 IMPLANT
SCISSORS LAP 5X35 DISP (ENDOMECHANICALS) IMPLANT
SET CYSTO W/LG BORE CLAMP LF (SET/KITS/TRAYS/PACK) IMPLANT
SET TUBE SMOKE EVAC HIGH FLOW (TUBING) ×1 IMPLANT
STRIP CLOSURE SKIN 1/2X4 (GAUZE/BANDAGES/DRESSINGS) IMPLANT
SUT STRATA PDS 2-0 23 CT-1 (SUTURE) ×1 IMPLANT
SUT VICRYL 4-0 PS2 18IN ABS (SUTURE) ×2 IMPLANT
SUT VLOC 180 2-0 9IN GS21 (SUTURE) IMPLANT
SYR 10ML LL (SYRINGE) ×1 IMPLANT
TOWEL GREEN STERILE FF (TOWEL DISPOSABLE) ×2 IMPLANT
TRAY FOL W/BAG SLVR 16FR STRL (SET/KITS/TRAYS/PACK) IMPLANT
TRAY FOLEY W/BAG SLVR 14FR (SET/KITS/TRAYS/PACK) ×1 IMPLANT
TROCAR ADV FIXATION 5X100MM (TROCAR) ×3 IMPLANT
TROCAR Z-THREAD OPTICAL 5X100M (TROCAR) ×1 IMPLANT
UNDERPAD 30X36 HEAVY ABSORB (UNDERPADS AND DIAPERS) ×1 IMPLANT

## 2023-12-30 NOTE — Progress Notes (Addendum)
 Postop Progress Note  Notified by RN that patient having bladder spasms and cramping - s/p fentanyl without relief, just received dose of dilaudid and pain is much improved. Also s/p pyridium dose.  At bedside to assess patient. VSS, she looks overall well. Abdomen soft, nontender, nondistended. Incisions covered with bandages, dry. No vaginal bleeding. Suspect pain secondary bladder spasm.  Has tried voiding x2 > bladder scan prior to second void trial 370ccs around 1200. Bladder scan now 545ccs.  I discussed with the patient option to trial third void vs replacing foley for bladder rest and obs overnight. She prefers replacement of foley catheter and void trial in AM. Will place orders for obs > will give her opportunity to void again in about an hour, if unable will replace foley catheter.  Jule Economy, MD

## 2023-12-30 NOTE — Anesthesia Procedure Notes (Signed)
 Anesthesia Regional Block: TAP block   Pre-Anesthetic Checklist: , timeout performed,  Correct Patient, Correct Site, Correct Laterality,  Correct Procedure, Correct Position, site marked,  Risks and benefits discussed,  Surgical consent,  Pre-op evaluation,  At surgeon's request and post-op pain management  Laterality: Left  Prep: chloraprep       Needles:  Injection technique: Single-shot  Needle Type: Echogenic Stimulator Needle     Needle Length: 10cm  Needle Gauge: 21     Additional Needles:   Procedures:,,,, ultrasound used (permanent image in chart),,    Narrative:  Start time: 12/30/2023 7:05 AM End time: 12/30/2023 7:10 AM Injection made incrementally with aspirations every 5 mL.  Performed by: Personally   Additional Notes: No pain on injection. No increased resistance to injection. Injection made in 5cc increments.  Good needle visualization.  Patient tolerated procedure well.

## 2023-12-30 NOTE — Op Note (Addendum)
 PREOPERATIVE DIAGNOSES: 1. Fibroid uterus 2. Abnormal uterine bleeding  POSTOPERATIVE DIAGNOSES: Same Bilateral hydrosalpinx with adhesions Endometriosis lesions  PROCEDURE PERFORMED: Total laparoscopic hysterectomy, bilateral salpingectomy  SURGEON: Dr. Jule Economy  ASSISTANT: Dr. Nilda Simmer  ANESTHESIA: General, TAP block in preop  ESTIMATED BLOOD LOSS: 50ccs; minimal  COMPLICATIONS: None  TUBES: None.  DRAINS: None  PATHOLOGY: Uterus, bilateral fallopian tubes, Kyleena IUD to pathology  FINDINGS: On exam, under anesthesia, normal appearing vulva and vagina, 8 week sized uterus. Multiple subserosal fibroids and adhesions noted between dilated hydrosalpinges and uterus/bilateral ovaries. Small endometriosis lesions noted in posterior cul-de-sac. Thick scar tissue anteriorly in vesicouterine peritoneum.  The patient was consented and seen preoperatively. Consent was signed and witnessed prior to procedure start with all questions answered. Antibiotics were given as appropriate indicated. She was taken to the operating room, and placed under general anesthesia with pneumatic compression stockings in place on her lower extremities. The patient was then placed in a dorsal lithotomy position with Allen-type stirrups. An exam under anesthesia was performed prior to the procedure. The cervix and vagina were grossly normal with no obvious masses or deformities. The patient was prepped and draped in the normal sterile fashion. A time-out was performed with everyone in agreement.  A Foley catheter was inserted with yellow urine found to be draining appropriately. Attention was turned to the vagina. A uterine manipulator was placed in the vagina and around the cervix without difficulty. Attention was then turned to the abdomen.  The skin in and around the umbilicus was injected with plain Bupivacaine. A 5mm skin incision was made at the base of the umbilicus. The 5mm Optiview trocar with  rounded tip obturator was introduced to the abdomen without difficulty with careful elevation of the anterior abdominal wall. The obturator was removed. The CO2 line was attached and pneumoperitoneum was initiated at high flow and pressure of 15mm Hg. The 5mm scope was then inserted to the port for inspection of the abdomen with findings noted above.  Next, three additional 5mm ports were placed, laterally and between the lateral lower quadrant port and the umbilical port as well as a lower left quadrant port. The skin was anesthetized in a similar fashion as above. A small skin incision was made with the scalpel bilaterally. 5 mm ports were then placed under direct visualization without difficulty.  The patient was placed in Trendelenburg and the bowel was readily mobile allowing for better visualization of the pelvis. The uterus was noted to be mobile with adequate manipulation with use of the uterine manipulator.  With the uterus and adnexa retracted to the opposite side, the round ligament was placed on traction and divded using the LigaSure. The anterior leaf of the broad ligament was transected down to the level of the vesicouterine peritoneum. The vesicouterine peritoneum was opened with the ligasure and the bladder was dissected off of the lower uterine segment and cervix. The fimbriated ends of the Fallpian tubes were grasped, and the LigaSure was used to divide the mesosalpinx. The uteroovarian ligament was then sealed and divided. The posterior leaf of the broad ligament was divided. The uterine vessels were identified along the uterus, and the LigaSure was used to seal and cut the uterine vessels. This procedure was repeated in an identical fashion on the contralateral side. The uterine manipulator was noted to have perforated at the fundus of the uterus. A V-care manipulator was placed without difficulty.   The Megadyne hook was used to create a circumferential incision along  the cervicovaginal  junction. The uterosacral and cardinal ligament complexes and vagina were completely detached from the cervix.  The uterus, cervix, bilateral Fallopian tubes were removed through the vagina, along with the uterine manipulator. The Rutha Bouchard was located in the LLQ, likely perforated through with the initial manipulator perforation and was removed through the vagina. The vaginal cuff was closed with V-loc suture laparoscopically without difficulty. Edges of the vaginal cuff were noted to be hemostatic. Arista was applied along the closed cuff.  Skin incisions were closed with 4-0 Monocryl. A digital exam was performed and the cuff was noted to be intact vaginally. Foley catheter was removed. The patient tolerated the procedure well. She was taken to the recovery room in stable condition. The patient will be discharged from the PACU after all criteria are met.   Jule Economy, MD

## 2023-12-30 NOTE — Anesthesia Procedure Notes (Signed)
 Procedure Name: Intubation Date/Time: 12/30/2023 7:51 AM  Performed by: Little Ishikawa, CRNAPre-anesthesia Checklist: Patient identified, Emergency Drugs available, Suction available and Patient being monitored Patient Re-evaluated:Patient Re-evaluated prior to induction Oxygen Delivery Method: Circle System Utilized Preoxygenation: Pre-oxygenation with 100% oxygen Induction Type: IV induction Ventilation: Mask ventilation without difficulty Laryngoscope Size: Mac and 3 Grade View: Grade I Tube type: Oral Number of attempts: 1 Airway Equipment and Method: Stylet and Oral airway Placement Confirmation: ETT inserted through vocal cords under direct vision, positive ETCO2 and breath sounds checked- equal and bilateral Secured at: 21 cm Tube secured with: Tape Dental Injury: Teeth and Oropharynx as per pre-operative assessment

## 2023-12-30 NOTE — Anesthesia Postprocedure Evaluation (Signed)
 Anesthesia Post Note  Patient: [name]  Procedure(s) Performed: HYSTERECTOMY, TOTAL, LAPAROSCOPIC, WITH SALPINGECTOMY (Bilateral: Vagina )     Patient location during evaluation: PACU Anesthesia Type: General Level of consciousness: awake and alert Pain management: pain level controlled Vital Signs Assessment: post-procedure vital signs reviewed and stable Respiratory status: spontaneous breathing, nonlabored ventilation, respiratory function stable and patient connected to nasal cannula oxygen Cardiovascular status: blood pressure returned to baseline and stable Postop Assessment: no apparent nausea or vomiting Anesthetic complications: no   No notable events documented.  Last Vitals:    Last Pain:                 Collene Schlichter

## 2023-12-30 NOTE — Anesthesia Procedure Notes (Signed)
 Anesthesia Regional Block: TAP block   Pre-Anesthetic Checklist: , timeout performed,  Correct Patient, Correct Site, Correct Laterality,  Correct Procedure, Correct Position, site marked,  Risks and benefits discussed,  Surgical consent,  Pre-op evaluation,  At surgeon's request and post-op pain management  Laterality: Right  Prep: chloraprep       Needles:  Injection technique: Single-shot  Needle Type: Echogenic Stimulator Needle     Needle Length: 10cm  Needle Gauge: 21     Additional Needles:   Procedures:,,,, ultrasound used (permanent image in chart),,    Narrative:  Start time: 12/30/2023 7:10 AM End time: 12/30/2023 7:15 AM Injection made incrementally with aspirations every 5 mL.  Performed by: Personally   Additional Notes: No pain on injection. No increased resistance to injection. Injection made in 5cc increments.  Good needle visualization.  Patient tolerated procedure well.

## 2023-12-30 NOTE — Transfer of Care (Signed)
 Immediate Anesthesia Transfer of Care Note  Patient: [name]  Procedure(s) Performed: HYSTERECTOMY, TOTAL, LAPAROSCOPIC, WITH SALPINGECTOMY (Bilateral: Vagina )  Patient Location: PACU  Anesthesia Type:General  Level of Consciousness: drowsy  Airway & Oxygen Therapy: Patient Spontanous Breathing and Patient connected to nasal cannula oxygen  Post-op Assessment: Report given to RN and Post -op Vital signs reviewed and stable  Post vital signs: Reviewed and stable  Last Vitals:  Vitals Value Taken Time  BP 106/55 12/30/23 1018  Temp    Pulse 73 12/30/23 1018  Resp 22 12/30/23 1018  SpO2 97 % 12/30/23 1018  Vitals shown include unfiled device data.  Last Pain:  Vitals:   12/30/23 0715  PainSc: 0-No pain         Complications: No notable events documented.

## 2023-12-30 NOTE — Interval H&P Note (Signed)
 History and Physical Interval Note:  12/30/2023 7:07 AM  [name]  has presented today for surgery, with the diagnosis of FIBROIDS.  The various methods of treatment have been discussed with the patient and family. After consideration of risks, benefits and other options for treatment, the patient has consented to  Procedure(s): HYSTERECTOMY, TOTAL, LAPAROSCOPIC, WITH SALPINGECTOMY (Bilateral) as a surgical intervention.  The patient's history has been reviewed, patient examined, no change in status, stable for surgery.  I have reviewed the patient's chart and labs.  Questions were answered to the patient's satisfaction.   TAP block performed by anesthesia preoperatively.   Tawni Levy

## 2023-12-30 NOTE — Discharge Instructions (Addendum)
    Information for Discharge Teaching: EXPAREL (bupivacaine liposome injectable suspension)   Pain relief is important to your recovery. The goal is to control your pain so you can move easier and return to your normal activities as soon as possible after your procedure. Your physician may use several types of medicines to manage pain, swelling, and more.  Your surgeon or anesthesiologist gave you EXPAREL(bupivacaine) to help control your pain after surgery.  EXPAREL is a local anesthetic designed to release slowly over an extended period of time to provide pain relief by numbing the tissue around the surgical site. EXPAREL is designed to release pain medication over time and can control pain for up to 72 hours. Depending on how you respond to EXPAREL, you may require less pain medication during your recovery. EXPAREL can help reduce or eliminate the need for opioids during the first few days after surgery when pain relief is needed the most. EXPAREL is not an opioid and is not addictive. It does not cause sleepiness or sedation.   Important! A teal colored band has been placed on your arm with the date, time and amount of EXPAREL you have received. Please leave this armband in place for the full 96 hours following administration, and then you may remove the band. If you return to the hospital for any reason within 96 hours following the administration of EXPAREL, the armband provides important information that your health care providers to know, and alerts them that you have received this anesthetic.    Possible side effects of EXPAREL: Temporary loss of sensation or ability to move in the area where medication was injected. Nausea, vomiting, constipation Rarely, numbness and tingling in your mouth or lips, lightheadedness, or anxiety may occur. Call your doctor right away if you think you may be experiencing any of these sensations, or if you have other questions regarding possible side  effects.  Follow all other discharge instructions given to you by your surgeon or nurse. Eat a healthy diet and drink plenty of water or other fluids.  Post Anesthesia Home Care Instructions  Activity: Get plenty of rest for the remainder of the day. A responsible individual must stay with you for 24 hours following the procedure.  For the next 24 hours, DO NOT: -Drive a car -Advertising copywriter -Drink alcoholic beverages -Take any medication unless instructed by your physician -Make any legal decisions or sign important papers.  Meals: Start with liquid foods such as gelatin or soup. Progress to regular foods as tolerated. Avoid greasy, spicy, heavy foods. If nausea and/or vomiting occur, drink only clear liquids until the nausea and/or vomiting subsides. Call your physician if vomiting continues.  Special Instructions/Symptoms: Your throat may feel dry or sore from the anesthesia or the breathing tube placed in your throat during surgery. If this causes discomfort, gargle with warm salt water. The discomfort should disappear within 24 hours.  If you had a scopolamine patch placed behind your ear for the management of post- operative nausea and/or vomiting:  1. The medication in the patch is effective for 72 hours, after which it should be removed.  Wrap patch in a tissue and discard in the trash. Wash hands thoroughly with soap and water. 2. You may remove the patch earlier than 72 hours if you experience unpleasant side effects which may include dry mouth, dizziness or visual disturbances. 3. Avoid touching the patch. Wash your hands with soap and water after contact with the patch.

## 2023-12-31 ENCOUNTER — Encounter (HOSPITAL_COMMUNITY): Payer: Self-pay | Admitting: Obstetrics and Gynecology

## 2023-12-31 DIAGNOSIS — N938 Other specified abnormal uterine and vaginal bleeding: Secondary | ICD-10-CM | POA: Diagnosis not present

## 2023-12-31 LAB — SURGICAL PATHOLOGY

## 2023-12-31 NOTE — Progress Notes (Signed)
 Postop Progress Note  S: Feeling very well. Minimal pian overnight. Tolerating PO intake without nausea. UOP excellent.  O:  Vitals     12/31/2023    7:40 AM 12/31/2023   12:10 AM 12/30/2023    7:34 PM  Vitals with BMI  Systolic 117 119 604  Diastolic 75 71 80  Pulse 84 94 102    Gen: NAD Cv: reg rate Pulm: NWOB Abd: soft, nondistended, appropriately ttp, incisions clean/dry Ext: no edema b/l GYN: Min blood  UOP excellent  A/P: 42yo s/p TLH, BS.  # Postop:  - Doing very well postoperatively, no vaginal bleeding - Incisions c/d - Foley catheter replaced yesterday afternoon for postop urinary retention - UOP has been excellent. Will remove this AM and f/u void.  Dispo: plan dc after void, postop f/u in 2 weeks scheduled in the office  Jule Economy, MD

## 2023-12-31 NOTE — Progress Notes (Signed)
Pt discharged home in stable condition after discharge instructions given. Pt verbalized understanding and all questions were answered. IV was discontinued and pt was sent home with all belongings.

## 2024-02-14 ENCOUNTER — Encounter: Payer: Self-pay | Admitting: Family Medicine

## 2024-02-14 DIAGNOSIS — R41840 Attention and concentration deficit: Secondary | ICD-10-CM

## 2024-02-15 MED ORDER — METHYLPHENIDATE HCL ER (OSM) 36 MG PO TBCR
36.0000 mg | EXTENDED_RELEASE_TABLET | Freq: Every day | ORAL | 0 refills | Status: DC
Start: 2024-02-15 — End: 2024-05-31

## 2024-02-15 NOTE — Telephone Encounter (Signed)
 Requesting: Concerta  Contract: 06/2023 UDS: 06/2023 Last OV: 11/15/23 Next OV: 11/16/24 Last Refill: 11/01/2023, #30--0 RF Database:   Please advise

## 2024-05-16 ENCOUNTER — Other Ambulatory Visit: Payer: Self-pay | Admitting: Obstetrics and Gynecology

## 2024-05-16 DIAGNOSIS — Z1231 Encounter for screening mammogram for malignant neoplasm of breast: Secondary | ICD-10-CM

## 2024-05-30 ENCOUNTER — Encounter: Payer: Self-pay | Admitting: Family Medicine

## 2024-05-30 DIAGNOSIS — R41840 Attention and concentration deficit: Secondary | ICD-10-CM

## 2024-05-30 NOTE — Telephone Encounter (Signed)
**Note De-identified  [place] Obfuscation** Please advise 

## 2024-05-31 ENCOUNTER — Other Ambulatory Visit: Payer: Self-pay

## 2024-05-31 DIAGNOSIS — R41840 Attention and concentration deficit: Secondary | ICD-10-CM

## 2024-05-31 MED ORDER — METHYLPHENIDATE HCL ER (OSM) 36 MG PO TBCR
36.0000 mg | EXTENDED_RELEASE_TABLET | Freq: Every day | ORAL | 0 refills | Status: DC
Start: 2024-05-31 — End: 2024-06-01

## 2024-05-31 NOTE — Telephone Encounter (Signed)
 Requesting: Concerta  Contract: 07/08/23 UDS: 06/1023 Last OV: 11/15/2023 Next OV: 11/16/2024 Last Refill: 02/15/2024, #30--0 RF Database:   Please advise

## 2024-06-01 ENCOUNTER — Encounter: Payer: Self-pay | Admitting: Family Medicine

## 2024-06-01 ENCOUNTER — Ambulatory Visit: Payer: PRIVATE HEALTH INSURANCE | Admitting: Family Medicine

## 2024-06-01 VITALS — BP 120/92 | HR 84 | Temp 98.1°F | Resp 16 | Ht 66.0 in | Wt 183.0 lb

## 2024-06-01 DIAGNOSIS — R41840 Attention and concentration deficit: Secondary | ICD-10-CM

## 2024-06-01 DIAGNOSIS — Z111 Encounter for screening for respiratory tuberculosis: Secondary | ICD-10-CM

## 2024-06-01 DIAGNOSIS — Z79899 Other long term (current) drug therapy: Secondary | ICD-10-CM

## 2024-06-01 MED ORDER — METHYLPHENIDATE HCL ER (OSM) 36 MG PO TBCR: 36.0000 mg | EXTENDED_RELEASE_TABLET | Freq: Every day | ORAL | 0 refills | Status: AC

## 2024-06-01 NOTE — Progress Notes (Signed)
 Subjective:    Patient ID: [name], female    DOB: October 06, 1980, 43 y.o.   MRN: [id number]  Chief Complaint  Patient presents with   ADD   Follow-up    HPI Patient is in today for Add f/u.  Discussed the use of AI scribe software for clinical note transcription with the patient, who gave verbal consent to proceed.  History of Present Illness [name] is a 43 year old female who presents for a medication refill and TB screening for school.  She is here for a refill of her medication, Concerta , which she takes at a dose of 36 mg once daily. The medication is generally effective, although there are days when she feels overstimulated. However, she feels that most days the current dose is sufficient.  She is also here for a QuantiFERON Gold test for TB screening required for her school. She is currently working full-time and attending school full-time, and plans to switch to PRN status in January when her clinicals start. She is pursuing a Publishing rights manager program.  She discusses her busy schedule and the challenges of balancing work and school.  Discussed the use of AI scribe software for clinical note transcription with the patient, who gave verbal consent to proceed.  History of Present Illness [name] is a 43 year old female who presents for a medication refill and TB screening for school.  She is here for a refill of her medication, Concerta , which she takes at a dose of 36 mg once daily. The medication is generally effective, although there are days when she feels overstimulated. However, she feels that most days the current dose is sufficient.  She is also here for a QuantiFERON Gold test for TB screening required for her school. She is currently working full-time and attending school full-time, and plans to switch to PRN status in January when her clinicals start. She is pursuing a Publishing rights manager program.  She discusses her busy schedule  and the challenges of balancing work and school.   Past Medical History:  Diagnosis Date   ADHD    Anemia    Anxiety    Anxiety and depression    History of gestational hypertension    Leukopenia    Pre-eclampsia    2010    Past Surgical History:  Procedure Laterality Date   BREAST BIOPSY Right    2023   CESAREAN SECTION     2010   EYE SURGERY  12/25/2016   [facility]   TOTAL LAPAROSCOPIC HYSTERECTOMY WITH SALPINGECTOMY Bilateral 12/30/2023   Procedure: HYSTERECTOMY, TOTAL, LAPAROSCOPIC, WITH SALPINGECTOMY;  Surgeon: Laurence Slater PARAS, MD;  Location: [facility] OR;  Service: Gynecology;  Laterality: Bilateral;    Family History  Problem Relation Age of Onset   Hypertension Father    Diabetes Father        Type 2   Hyperlipidemia Father    Lung cancer Maternal Aunt    Liver cancer Maternal Aunt    Heart disease Paternal Grandmother    Pancreatic cancer Paternal Grandfather     Social History   Socioeconomic History   Marital status: Widowed    Spouse name: Titiana Severa   Number of children: 1   Years of education: Not on file   Highest education level: Master's degree (e.g., MA, MS, MEng, MEd, MSW, MBA)  Occupational History   Occupation: Teacher, adult education: [employer]    Comment: nurse, Neuro ICU  Tobacco  Use   Smoking status: Never   Smokeless tobacco: Never  Vaping Use   Vaping status: Never Used  Substance and Sexual Activity   Alcohol use: No   Drug use: No   Sexual activity: Yes    Partners: Male  Other Topics Concern   Not on file  Social History Narrative   Not on file   Social Drivers of Health   Financial Resource Strain: Low Risk  (05/31/2024)   Overall Financial Resource Strain (CARDIA)    Difficulty of Paying Living Expenses: Not hard at all  Food Insecurity: No Food Insecurity (05/31/2024)   Hunger Vital Sign    Worried About Running Out of Food in the Last Year: Never true    Ran Out of Food in the Last Year: Never true  Transportation Needs: No  Transportation Needs (05/31/2024)   PRAPARE - Administrator, Civil Service (Medical): No    Lack of Transportation (Non-Medical): No  Physical Activity: Insufficiently Active (05/31/2024)   Exercise Vital Sign    Days of Exercise per Week: 3 days    Minutes of Exercise per Session: 10 min  Stress: Stress Concern Present (05/31/2024)   [employer] of Occupational Health - Occupational Stress Questionnaire    Feeling of Stress: Rather much  Social Connections: Socially Integrated (05/31/2024)   Social Connection and Isolation Panel    Frequency of Communication with Friends and Family: More than three times a week    Frequency of Social Gatherings with Friends and Family: Once a week    Attends Religious Services: 1 to 4 times per year    Active Member of [employer] or Organizations: Yes    Attends Banker Meetings: 1 to 4 times per year    Marital Status: Living with partner  Intimate Partner Violence: Not At Risk (12/30/2023)   Humiliation, Afraid, Rape, and Kick questionnaire    Fear of Current or Ex-Partner: No    Emotionally Abused: No    Physically Abused: No    Sexually Abused: No    Outpatient Medications Prior to Visit  Medication Sig Dispense Refill   acetaminophen  (TYLENOL ) 500 MG tablet Take 1 tablet (500 mg total) by mouth every 6 (six) hours as needed. 30 tablet 1   buPROPion  (WELLBUTRIN  XL) 300 MG 24 hr tablet 1 tablet every day by oral route. 90 tablet 3   cetirizine (ZYRTEC) 10 MG tablet Take 10 mg by mouth daily.     ibuprofen  (ADVIL ) 600 MG tablet Take 1 tablet (600 mg total) by mouth every 6 (six) hours as needed. 30 tablet 1   oxyCODONE  (ROXICODONE ) 5 MG immediate release tablet Take 1 tablet (5 mg total) by mouth every 4 (four) hours as needed for severe pain (pain score 7-10). 12 tablet 0   senna-docusate (SENOKOT-S) 8.6-50 MG tablet Take 1 tablet by mouth daily. 30 tablet 1   SUMAtriptan  (IMITREX ) 50 MG tablet May repeat in 2 hours if headache  persists or recurs. 12 tablet 5   methylphenidate  (CONCERTA ) 36 MG PO CR tablet Take 1 tablet (36 mg total) by mouth daily. 30 tablet 0   No facility-administered medications prior to visit.    Allergies  Allergen Reactions   Sulfonamide Derivatives Swelling and Rash    Review of Systems  Constitutional:  Negative for fever and malaise/fatigue.  HENT:  Negative for congestion.   Eyes:  Negative for blurred vision.  Respiratory:  Negative for cough and shortness of breath.  Cardiovascular:  Negative for chest pain, palpitations and leg swelling.  Gastrointestinal:  Negative for abdominal pain, blood in stool, nausea and vomiting.  Genitourinary:  Negative for dysuria and frequency.  Musculoskeletal:  Negative for back pain and falls.  Skin:  Negative for rash.  Neurological:  Negative for dizziness, loss of consciousness and headaches.  Endo/Heme/Allergies:  Negative for environmental allergies.  Psychiatric/Behavioral:  Negative for depression. The patient is not nervous/anxious.        Objective:    Physical Exam Vitals and nursing note reviewed.  Constitutional:      General: She is not in acute distress.    Appearance: Normal appearance. She is well-developed.  HENT:     Head: Normocephalic and atraumatic.  Eyes:     General: No scleral icterus.       Right eye: No discharge.        Left eye: No discharge.  Cardiovascular:     Rate and Rhythm: Normal rate and regular rhythm.     Heart sounds: No murmur heard. Pulmonary:     Effort: Pulmonary effort is normal. No respiratory distress.     Breath sounds: Normal breath sounds.  Musculoskeletal:        General: Normal range of motion.     Cervical back: Normal range of motion and neck supple.     Right lower leg: No edema.     Left lower leg: No edema.  Skin:    General: Skin is warm and dry.  Neurological:     Mental Status: She is alert and oriented to person, place, and time.  Psychiatric:        Mood and  Affect: Mood normal.        Behavior: Behavior normal.        Thought Content: Thought content normal.        Judgment: Judgment normal.     BP (!) 120/92 (BP Location: Right Arm, Patient Position: Sitting, Cuff Size: Normal)   Pulse 84   Temp 98.1 F (36.7 C) (Oral)   Resp 16   Ht 5' 6 (1.676 m)   Wt 183 lb (83 kg)   SpO2 100%   BMI 29.54 kg/m  Wt Readings from Last 3 Encounters:  06/01/24 183 lb (83 kg)  12/30/23 179 lb (81.2 kg)  12/21/23 181 lb 8 oz (82.3 kg)    Diabetic Foot Exam - Simple   No data filed    Lab Results  Component Value Date   WBC 4.7 12/21/2023   HGB 13.2 12/21/2023   HCT 40.4 12/21/2023   PLT 287 12/21/2023   GLUCOSE 91 12/21/2023   CHOL 171 11/15/2023   TRIG 89.0 11/15/2023   HDL 65.40 11/15/2023   LDLCALC 88 11/15/2023   ALT 10 11/15/2023   AST 11 11/15/2023   NA 137 12/21/2023   K 4.0 12/21/2023   CL 105 12/21/2023   CREATININE 0.93 12/21/2023   BUN 14 12/21/2023   CO2 25 12/21/2023   TSH 1.81 11/15/2023   HGBA1C 5.4 06/16/2018    Lab Results  Component Value Date   TSH 1.81 11/15/2023   Lab Results  Component Value Date   WBC 4.7 12/21/2023   HGB 13.2 12/21/2023   HCT 40.4 12/21/2023   MCV 95.5 12/21/2023   PLT 287 12/21/2023   Lab Results  Component Value Date   NA 137 12/21/2023   K 4.0 12/21/2023   CO2 25 12/21/2023   GLUCOSE 91 12/21/2023  BUN 14 12/21/2023   CREATININE 0.93 12/21/2023   BILITOT 0.5 11/15/2023   ALKPHOS 55 11/15/2023   AST 11 11/15/2023   ALT 10 11/15/2023   PROT 6.6 11/15/2023   ALBUMIN 4.4 11/15/2023   CALCIUM 9.2 12/21/2023   ANIONGAP 7 12/21/2023   EGFR 86 11/13/2022   GFR 98.59 11/15/2023   Lab Results  Component Value Date   CHOL 171 11/15/2023   Lab Results  Component Value Date   HDL 65.40 11/15/2023   Lab Results  Component Value Date   LDLCALC 88 11/15/2023   Lab Results  Component Value Date   TRIG 89.0 11/15/2023   Lab Results  Component Value Date   CHOLHDL  3 11/15/2023   Lab Results  Component Value Date   HGBA1C 5.4 06/16/2018       Assessment & Plan:  Attention or concentration deficit -     Drug Monitoring Panel [id number] , Urine -     Methylphenidate  HCl ER (OSM); Take 1 tablet (36 mg total) by mouth daily.  Dispense: 30 tablet; Refill: 0 -     Methylphenidate  HCl ER (OSM); Take 1 tablet (36 mg total) by mouth daily.  Dispense: 30 tablet; Refill: 0  High risk medication use -     Drug Monitoring Panel [phone] , Urine  Screening-pulmonary TB -     QuantiFERON-TB Gold Plus  Assessment and Plan Assessment & Plan Attention and concentration deficit   Attention and concentration deficit is managed with Concerta  36 mg. She reports the medication is generally effective, though occasionally she feels overstimulated and considers increasing the dose. No changes have been made as the current dosage is sufficient most days. Balancing full-time work and school may contribute to overstimulation. Continue Concerta  36 mg once daily. Provide prescription for Concerta  for October and November.    Layna Roeper R Lowne Chase, DO

## 2024-06-01 NOTE — Assessment & Plan Note (Signed)
 Stable with concerta  Dataase reviewed  Contract signed  Uds updated

## 2024-06-03 LAB — DRUG MONITORING PANEL 376104, URINE
Amphetamines: NEGATIVE ng/mL (ref <500)
Barbiturates: NEGATIVE ng/mL (ref <300)
Benzodiazepines: NEGATIVE ng/mL (ref <100)
Cocaine Metabolite: NEGATIVE ng/mL (ref <150)
Desmethyltramadol: NEGATIVE ng/mL (ref <100)
Opiates: NEGATIVE ng/mL (ref <100)
Oxycodone: NEGATIVE ng/mL (ref <100)
Tramadol: NEGATIVE ng/mL (ref <100)

## 2024-06-03 LAB — DM TEMPLATE

## 2024-06-04 ENCOUNTER — Ambulatory Visit: Payer: Self-pay | Admitting: Family Medicine

## 2024-06-04 LAB — QUANTIFERON-TB GOLD PLUS
Mitogen-NIL: 8.27 [IU]/mL
NIL: 0.02 [IU]/mL
QuantiFERON-TB Gold Plus: NEGATIVE
TB1-NIL: 0 [IU]/mL
TB2-NIL: 0.02 [IU]/mL

## 2024-06-15 ENCOUNTER — Ambulatory Visit
Admission: RE | Admit: 2024-06-15 | Discharge: 2024-06-15 | Disposition: A | Discharge status: Home / Self Care | Payer: PRIVATE HEALTH INSURANCE | Source: Ambulatory Visit | Attending: Obstetrics and Gynecology | Admitting: Obstetrics and Gynecology

## 2024-06-15 ENCOUNTER — Other Ambulatory Visit: Payer: Self-pay | Admitting: Obstetrics and Gynecology

## 2024-06-15 DIAGNOSIS — N6310 Unspecified lump in the right breast, unspecified quadrant: Secondary | ICD-10-CM

## 2024-06-15 DIAGNOSIS — Z1231 Encounter for screening mammogram for malignant neoplasm of breast: Secondary | ICD-10-CM

## 2024-06-20 ENCOUNTER — Ambulatory Visit
Admission: RE | Admit: 2024-06-20 | Discharge: 2024-06-20 | Disposition: A | Discharge status: Home / Self Care | Payer: PRIVATE HEALTH INSURANCE | Source: Ambulatory Visit | Attending: Obstetrics and Gynecology

## 2024-06-20 ENCOUNTER — Ambulatory Visit
Admission: RE | Admit: 2024-06-20 | Discharge: 2024-06-20 | Disposition: A | Discharge status: Home / Self Care | Payer: PRIVATE HEALTH INSURANCE | Source: Ambulatory Visit | Attending: Obstetrics and Gynecology | Admitting: Obstetrics and Gynecology

## 2024-06-20 DIAGNOSIS — N6310 Unspecified lump in the right breast, unspecified quadrant: Secondary | ICD-10-CM

## 2024-11-16 ENCOUNTER — Encounter: Payer: PRIVATE HEALTH INSURANCE | Admitting: Family Medicine
